# Patient Record
Sex: Male | Born: 1960 | Race: White | Hispanic: No | Marital: Single | Smoking: Former smoker
Health system: Southern US, Community
[De-identification: ages and names within clinical notes are randomized; demographics above are authoritative.]

## PROBLEM LIST (undated history)

## (undated) DIAGNOSIS — I219 Acute myocardial infarction, unspecified: Secondary | ICD-10-CM

## (undated) DIAGNOSIS — M109 Gout, unspecified: Secondary | ICD-10-CM

## (undated) DIAGNOSIS — F32A Depression, unspecified: Secondary | ICD-10-CM

## (undated) DIAGNOSIS — I10 Essential (primary) hypertension: Secondary | ICD-10-CM

## (undated) DIAGNOSIS — F419 Anxiety disorder, unspecified: Secondary | ICD-10-CM

## (undated) DIAGNOSIS — F329 Major depressive disorder, single episode, unspecified: Secondary | ICD-10-CM

## (undated) HISTORY — PX: ANKLE SURGERY: SHX546

## (undated) HISTORY — DX: Acute myocardial infarction, unspecified: I21.9

## (undated) HISTORY — PX: OTHER SURGICAL HISTORY: SHX169

## (undated) HISTORY — DX: Depression, unspecified: F32.A

## (undated) HISTORY — DX: Essential (primary) hypertension: I10

## (undated) HISTORY — DX: Anxiety disorder, unspecified: F41.9

---
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
## 2001-03-31 ENCOUNTER — Ambulatory Visit (HOSPITAL_COMMUNITY): Admission: RE | Admit: 2001-03-31 | Discharge: 2001-03-31 | Payer: Self-pay | Admitting: Internal Medicine

## 2001-03-31 ENCOUNTER — Encounter: Payer: Self-pay | Admitting: Internal Medicine

## 2004-02-18 ENCOUNTER — Ambulatory Visit (HOSPITAL_COMMUNITY): Admission: RE | Admit: 2004-02-18 | Discharge: 2004-02-18 | Payer: Self-pay | Admitting: Internal Medicine

## 2005-02-02 ENCOUNTER — Ambulatory Visit (HOSPITAL_COMMUNITY): Admission: RE | Admit: 2005-02-02 | Discharge: 2005-02-02 | Payer: Self-pay | Admitting: Internal Medicine

## 2005-02-19 ENCOUNTER — Ambulatory Visit: Payer: Self-pay | Admitting: Psychiatry

## 2005-02-19 ENCOUNTER — Inpatient Hospital Stay (HOSPITAL_COMMUNITY): Admission: AD | Admit: 2005-02-19 | Discharge: 2005-02-20 | Payer: Self-pay | Admitting: Psychiatry

## 2017-04-23 HISTORY — PX: ROTATOR CUFF REPAIR: SHX139

## 2017-09-19 ENCOUNTER — Ambulatory Visit (HOSPITAL_COMMUNITY)
Admission: RE | Admit: 2017-09-19 | Discharge: 2017-09-19 | Disposition: A | Discharge status: Home / Self Care | Payer: BLUE CROSS/BLUE SHIELD | Source: Ambulatory Visit | Attending: Internal Medicine | Admitting: Internal Medicine

## 2017-09-19 ENCOUNTER — Other Ambulatory Visit (HOSPITAL_COMMUNITY): Payer: Self-pay | Admitting: Internal Medicine

## 2017-09-19 DIAGNOSIS — M79605 Pain in left leg: Principal | ICD-10-CM

## 2017-09-19 DIAGNOSIS — M79671 Pain in right foot: Secondary | ICD-10-CM

## 2017-09-19 DIAGNOSIS — M79604 Pain in right leg: Secondary | ICD-10-CM

## 2017-09-19 DIAGNOSIS — M79672 Pain in left foot: Secondary | ICD-10-CM

## 2017-12-10 ENCOUNTER — Encounter: Payer: Self-pay | Admitting: Gastroenterology

## 2018-03-25 ENCOUNTER — Ambulatory Visit: Payer: BLUE CROSS/BLUE SHIELD | Admitting: Gastroenterology

## 2018-03-25 DIAGNOSIS — M75121 Complete rotator cuff tear or rupture of right shoulder, not specified as traumatic: Secondary | ICD-10-CM | POA: Diagnosis not present

## 2018-03-27 DIAGNOSIS — M75121 Complete rotator cuff tear or rupture of right shoulder, not specified as traumatic: Secondary | ICD-10-CM | POA: Diagnosis not present

## 2018-04-01 DIAGNOSIS — M75121 Complete rotator cuff tear or rupture of right shoulder, not specified as traumatic: Secondary | ICD-10-CM | POA: Diagnosis not present

## 2018-04-08 DIAGNOSIS — M75121 Complete rotator cuff tear or rupture of right shoulder, not specified as traumatic: Secondary | ICD-10-CM | POA: Diagnosis not present

## 2018-04-10 DIAGNOSIS — M75121 Complete rotator cuff tear or rupture of right shoulder, not specified as traumatic: Secondary | ICD-10-CM | POA: Diagnosis not present

## 2018-04-11 DIAGNOSIS — M25511 Pain in right shoulder: Secondary | ICD-10-CM | POA: Diagnosis not present

## 2018-05-09 DIAGNOSIS — M25511 Pain in right shoulder: Secondary | ICD-10-CM | POA: Diagnosis not present

## 2018-05-15 DIAGNOSIS — Z6838 Body mass index (BMI) 38.0-38.9, adult: Secondary | ICD-10-CM | POA: Diagnosis not present

## 2018-05-15 DIAGNOSIS — F329 Major depressive disorder, single episode, unspecified: Secondary | ICD-10-CM | POA: Diagnosis not present

## 2018-05-15 DIAGNOSIS — Z1389 Encounter for screening for other disorder: Secondary | ICD-10-CM | POA: Diagnosis not present

## 2018-05-15 DIAGNOSIS — I10 Essential (primary) hypertension: Secondary | ICD-10-CM | POA: Diagnosis not present

## 2018-06-12 ENCOUNTER — Encounter: Payer: Self-pay | Admitting: Gastroenterology

## 2018-06-12 ENCOUNTER — Ambulatory Visit: Payer: BLUE CROSS/BLUE SHIELD | Admitting: Gastroenterology

## 2018-06-12 ENCOUNTER — Telehealth: Payer: Self-pay | Admitting: Gastroenterology

## 2018-06-12 NOTE — Telephone Encounter (Signed)
PATIENT WAS A NO SHOW AND LETTER SENT  °

## 2018-10-23 ENCOUNTER — Other Ambulatory Visit (HOSPITAL_COMMUNITY): Payer: Self-pay | Admitting: Physician Assistant

## 2018-10-23 ENCOUNTER — Other Ambulatory Visit: Payer: Self-pay

## 2018-10-23 ENCOUNTER — Ambulatory Visit (HOSPITAL_COMMUNITY)
Admission: RE | Admit: 2018-10-23 | Discharge: 2018-10-23 | Disposition: A | Discharge status: Home / Self Care | Payer: BC Managed Care – PPO | Source: Ambulatory Visit | Attending: Physician Assistant | Admitting: Physician Assistant

## 2018-10-23 DIAGNOSIS — M25562 Pain in left knee: Secondary | ICD-10-CM | POA: Diagnosis not present

## 2018-10-23 DIAGNOSIS — I10 Essential (primary) hypertension: Secondary | ICD-10-CM | POA: Diagnosis not present

## 2018-10-23 DIAGNOSIS — M2392 Unspecified internal derangement of left knee: Secondary | ICD-10-CM | POA: Diagnosis not present

## 2018-10-23 DIAGNOSIS — M1712 Unilateral primary osteoarthritis, left knee: Secondary | ICD-10-CM | POA: Diagnosis not present

## 2018-10-23 DIAGNOSIS — Z1389 Encounter for screening for other disorder: Secondary | ICD-10-CM | POA: Diagnosis not present

## 2018-10-23 DIAGNOSIS — Z6838 Body mass index (BMI) 38.0-38.9, adult: Secondary | ICD-10-CM | POA: Diagnosis not present

## 2018-10-28 DIAGNOSIS — Z6839 Body mass index (BMI) 39.0-39.9, adult: Secondary | ICD-10-CM | POA: Diagnosis not present

## 2018-10-28 DIAGNOSIS — M1712 Unilateral primary osteoarthritis, left knee: Secondary | ICD-10-CM | POA: Diagnosis not present

## 2018-10-28 DIAGNOSIS — I10 Essential (primary) hypertension: Secondary | ICD-10-CM | POA: Diagnosis not present

## 2018-11-06 ENCOUNTER — Encounter: Payer: Self-pay | Admitting: *Deleted

## 2018-11-19 ENCOUNTER — Ambulatory Visit (INDEPENDENT_AMBULATORY_CARE_PROVIDER_SITE_OTHER): Payer: Self-pay | Admitting: *Deleted

## 2018-11-19 ENCOUNTER — Telehealth: Payer: Self-pay | Admitting: *Deleted

## 2018-11-19 ENCOUNTER — Other Ambulatory Visit: Payer: Self-pay

## 2018-11-19 ENCOUNTER — Encounter: Payer: Self-pay | Admitting: *Deleted

## 2018-11-19 DIAGNOSIS — Z1211 Encounter for screening for malignant neoplasm of colon: Secondary | ICD-10-CM

## 2018-11-19 MED ORDER — NA SULFATE-K SULFATE-MG SULF 17.5-3.13-1.6 GM/177ML PO SOLN: 1.0000 | Freq: Once | ORAL | 0 refills | Status: AC

## 2018-11-19 NOTE — Progress Notes (Signed)
Gastroenterology Pre-Procedure Review  Request Date: 11/19/2018 Requesting Physician: Collene Mares, PA-C @ [facility], No previous TCS  PATIENT REVIEW QUESTIONS: The patient responded to the following health history questions as indicated:    1. Diabetes Melitis: No 2. Joint replacements in the past 12 months: No 3. Major health problems in the past 3 months: Yes, bp elevated, seen PCP 3 wks ago, knee injection also 4. Has an artificial valve or MVP: No 5. Has a defibrillator: No 6. Has been advised in past to take antibiotics in advance of a procedure like teeth cleaning: No 7. Family history of colon cancer: No 8. Alcohol Use: Yes, 2 beers a week 9. History of sleep apnea: No 10. History of coronary artery or other vascular stents placed within the last 12 months: No 11. History of any prior anesthesia complications: No    MEDICATIONS & ALLERGIES:    Patient reports the following regarding taking any blood thinners:   Plavix? No Aspirin? Yes Coumadin? No Brilinta? No Xarelto? No Eliquis? No Pradaxa? No Savaysa? No Effient? No  Patient confirms/reports the following medications:  Current Outpatient Medications  Medication Sig Dispense Refill  . aspirin EC 81 MG tablet Take 81 mg by mouth daily.    Marland Kitchen escitalopram (LEXAPRO) 10 MG tablet Take 10 mg by mouth daily.    . Multiple Vitamins-Minerals (MENS 50+ MULTI VITAMIN/MIN) TABS Take by mouth daily.    Marland Kitchen olmesartan-hydrochlorothiazide (BENICAR HCT) 40-12.5 MG tablet Take 1 tablet by mouth daily.     No current facility-administered medications for this visit.     Patient confirms/reports the following allergies:  Allergies  Allergen Reactions  . Codeine Nausea And Vomiting  . Demerol  [Meperidine Hcl] Nausea Only    No orders of the defined types were placed in this encounter.   AUTHORIZATION INFORMATION Primary Insurance: [place],  [place] #: [id number],  Group #: [id number] Pre-Cert / Josem Kaufmann required: Pre-Cert / Auth #:    SCHEDULE INFORMATION: Procedure has been scheduled as follows:  Date: 01/16/2019, Time: 12:00 Location: [facility] with Dr. Gala Romney  This Gastroenterology Pre-Precedure Review Form is being routed to the following provider(s): Roseanne Kaufman, NP

## 2018-11-19 NOTE — Telephone Encounter (Signed)
Pt requested a work note to be mailed out stating that he must remain in quarantine after he has his COVID screening.  Mailed along with prep instructions.

## 2018-11-19 NOTE — Patient Instructions (Signed)
Dean Wang  Mar 30, 1961 MRN: [id number]     Procedure Date: 01/16/2019 Time to register: 11:00 am Place to register: [facility] Procedure Time: 12:00 pm Scheduled provider: Dr. Gala Romney    PREPARATION FOR COLONOSCOPY WITH SUPREP BOWEL PREP KIT  Note: Suprep Bowel Prep Kit is a split-dose (2day) regimen. Consumption of BOTH 6-ounce bottles is required for a complete prep.  Please notify us immediately if you are diabetic, take iron supplements, or if you are on Coumadin or any other blood thinners.                                                                                                                                                   2 DAYS BEFORE PROCEDURE:  DATE: 01/14/2019 DAY: Wednesday Begin clear liquid diet AFTER your lunch meal. NO SOLID FOODS after this point.  1 DAY BEFORE PROCEDURE:  DATE: 01/15/2019   DAY: Thursday Continue clear liquids the entire day - NO SOLID FOOD.    At 6:00pm: Complete steps 1 through 4 below, using ONE (1) 6-ounce bottle, before going to bed. Step 1:  Pour ONE (1) 6-ounce bottle of SUPREP liquid into the mixing container.  Step 2:  Add cool drinking water to the 16 ounce line on the container and mix.  Note: Dilute the solution concentrate as directed prior to use. Step 3:  DRINK ALL the liquid in the container. Step 4:  You MUST drink an additional two (2) or more 16 ounce containers of water over the next one (1) hour.   Continue clear liquids.  DAY OF PROCEDURE:   DATE: 01/16/2019  DAY: Friday If you take medications for your heart, blood pressure, or breathing, you may take these medications.    5 hours before your procedure at : 7:00 am Step 1:  Pour ONE (1) 6-ounce bottle of SUPREP liquid into the mixing container.  Step 2:  Add cool drinking water to the 16 ounce line on the container and mix.  Note: Dilute the solution concentrate as directed prior to use. Step 3:  DRINK ALL the liquid in the container. Step 4:   You MUST drink an additional two (2) or more 16 ounce containers of water over the next one (1) hour. You MUST complete the final glass of water at least 3 hours before your colonoscopy. Nothing by mouth past 9:00 am  You may take your morning medications with sip of water unless we have instructed otherwise.    Please see below for Dietary Information.  CLEAR LIQUIDS INCLUDE:  Water Jello (NOT red in color)   Ice Popsicles (NOT red in color)   Tea (sugar ok, no milk/cream) Powdered fruit flavored drinks  Coffee (sugar ok, no milk/cream) Gatorade/ Lemonade/ Kool-Aid  (NOT red in color)   Juice: apple, white grape, white cranberry Soft drinks  Clear bullion, consomme, broth (fat free beef/chicken/vegetable)  Carbonated beverages (any kind)  Strained chicken noodle soup Hard Candy   Remember: Clear liquids are liquids that will allow you to see your fingers on the other side of a clear glass. Be sure liquids are NOT red in color, and not cloudy, but CLEAR.  DO NOT EAT OR DRINK ANY OF THE FOLLOWING:  Dairy products of any kind   Cranberry juice Tomato juice / V8 juice   Grapefruit juice Orange juice     Red grape juice  Do not eat any solid foods, including such foods as: cereal, oatmeal, yogurt, fruits, vegetables, creamed soups, eggs, bread, crackers, pureed foods in a blender, etc.   HELPFUL HINTS FOR DRINKING PREP SOLUTION:   Make sure prep is extremely cold. Mix and refrigerate the the morning of the prep. You may also put in the freezer.   You may try mixing some Crystal Light or Country Time Lemonade if you prefer. Mix in small amounts; add more if necessary.  Try drinking through a straw  Rinse mouth with water or a mouthwash between glasses, to remove after-taste.  Try sipping on a cold beverage /ice/ popsicles between glasses of prep.  Place a piece of sugar-free hard candy in mouth between glasses.  If you become nauseated, try consuming smaller amounts, or stretch  out the time between glasses. Stop for 30-60 minutes, then slowly start back drinking.     OTHER INSTRUCTIONS  You will need a responsible adult at least 60 years of age to accompany you and drive you home. This person must remain in the waiting room during your procedure. The hospital will cancel your procedure if you do not have a responsible adult with you.   1. Wear loose fitting clothing that is easily removed. 2. Leave jewelry and other valuables at home.  3. Remove all body piercing jewelry and leave at home. 4. Total time from sign-in until discharge is approximately 2-3 hours. 5. You should go home directly after your procedure and rest. You can resume normal activities the day after your procedure. 6. The day of your procedure you should not:  Drive  Make legal decisions  Operate machinery  Drink alcohol  Return to work   You may call the office (Dept: [phone]) before 5:00pm, or page the doctor on call [phone]) after 5:00pm, for further instructions, if necessary.   Insurance Information YOU WILL NEED TO CHECK WITH YOUR INSURANCE COMPANY FOR THE BENEFITS OF COVERAGE YOU HAVE FOR THIS PROCEDURE.  UNFORTUNATELY, NOT ALL INSURANCE COMPANIES HAVE BENEFITS TO COVER ALL OR PART OF THESE TYPES OF PROCEDURES.  IT IS YOUR RESPONSIBILITY TO CHECK YOUR BENEFITS, HOWEVER, WE WILL BE GLAD TO ASSIST YOU WITH ANY CODES YOUR INSURANCE COMPANY MAY NEED.    PLEASE NOTE THAT MOST INSURANCE COMPANIES WILL NOT COVER A SCREENING COLONOSCOPY FOR PEOPLE UNDER THE AGE OF 50  IF YOU HAVE BCBS INSURANCE, YOU MAY HAVE BENEFITS FOR A SCREENING COLONOSCOPY BUT IF POLYPS ARE FOUND THE DIAGNOSIS WILL CHANGE AND THEN YOU MAY HAVE A DEDUCTIBLE THAT WILL NEED TO BE MET. SO PLEASE MAKE SURE YOU CHECK YOUR BENEFITS FOR A SCREENING COLONOSCOPY AS WELL AS A DIAGNOSTIC COLONOSCOPY.

## 2018-11-19 NOTE — Progress Notes (Signed)
Recommend Propofol. Please arrange office visit.

## 2018-11-20 ENCOUNTER — Encounter: Payer: Self-pay | Admitting: *Deleted

## 2018-11-20 NOTE — Progress Notes (Signed)
Letter was mailed with office visit appointment information including procedure cancellation.

## 2018-12-09 DIAGNOSIS — Z6838 Body mass index (BMI) 38.0-38.9, adult: Secondary | ICD-10-CM | POA: Diagnosis not present

## 2018-12-09 DIAGNOSIS — I10 Essential (primary) hypertension: Secondary | ICD-10-CM | POA: Diagnosis not present

## 2018-12-09 DIAGNOSIS — M1712 Unilateral primary osteoarthritis, left knee: Secondary | ICD-10-CM | POA: Diagnosis not present

## 2018-12-24 ENCOUNTER — Ambulatory Visit (INDEPENDENT_AMBULATORY_CARE_PROVIDER_SITE_OTHER): Payer: BC Managed Care – PPO | Admitting: Gastroenterology

## 2018-12-24 ENCOUNTER — Other Ambulatory Visit: Payer: Self-pay

## 2018-12-24 ENCOUNTER — Encounter: Payer: Self-pay | Admitting: Gastroenterology

## 2018-12-24 DIAGNOSIS — Z1211 Encounter for screening for malignant neoplasm of colon: Secondary | ICD-10-CM

## 2018-12-24 DIAGNOSIS — Z9189 Other specified personal risk factors, not elsewhere classified: Secondary | ICD-10-CM

## 2018-12-24 NOTE — Assessment & Plan Note (Signed)
Due for first-ever screening colonoscopy.  Denies GI symptoms.  Patient is concerned about receiving any type of pain medications during procedure which may induce significant nausea and vomiting.  Previously did poorly with Demerol.  Plan for deep sedation with assistance from anesthesiology due to antidepressant use, mild alcohol use and history of intolerance to medications.  I have discussed the risks, alternatives, benefits with regards to but not limited to the risk of reaction to medication, bleeding, infection, perforation and the patient is agreeable to proceed. Written consent to be obtained.

## 2018-12-24 NOTE — Patient Instructions (Signed)
1. Colonoscopy as scheduled. See separate instructions.  
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
## 2018-12-27 DIAGNOSIS — Z23 Encounter for immunization: Secondary | ICD-10-CM | POA: Diagnosis not present

## 2019-01-14 ENCOUNTER — Other Ambulatory Visit (HOSPITAL_COMMUNITY): Payer: BC Managed Care – PPO

## 2019-03-02 ENCOUNTER — Telehealth: Payer: Self-pay | Admitting: Internal Medicine

## 2019-03-02 NOTE — Telephone Encounter (Signed)
Tried to call mobile number, no answer, LMOVM for return call. Tried to call home number, no answer.

## 2019-03-02 NOTE — Telephone Encounter (Signed)
Patient needs to reschedule his procedure  

## 2019-03-03 ENCOUNTER — Encounter (HOSPITAL_COMMUNITY): Admission: RE | Admit: 2019-03-03 | Payer: BC Managed Care – PPO | Source: Ambulatory Visit

## 2019-03-03 ENCOUNTER — Other Ambulatory Visit (HOSPITAL_COMMUNITY): Payer: BC Managed Care – PPO

## 2019-03-05 ENCOUNTER — Encounter (HOSPITAL_COMMUNITY): Admission: RE | Payer: Self-pay | Source: Home / Self Care

## 2019-03-05 ENCOUNTER — Ambulatory Visit (HOSPITAL_COMMUNITY)
Admission: RE | Admit: 2019-03-05 | Payer: BC Managed Care – PPO | Source: Home / Self Care | Admitting: Internal Medicine

## 2019-03-05 SURGERY — COLONOSCOPY WITH PROPOFOL
Anesthesia: Monitor Anesthesia Care

## 2019-03-05 NOTE — Telephone Encounter (Signed)
Tried to call pt, no answer, LMOVM for return call.  

## 2019-03-05 NOTE — Telephone Encounter (Signed)
Letter mailed

## 2019-06-24 DIAGNOSIS — F329 Major depressive disorder, single episode, unspecified: Secondary | ICD-10-CM | POA: Diagnosis not present

## 2019-06-24 DIAGNOSIS — L03115 Cellulitis of right lower limb: Secondary | ICD-10-CM | POA: Diagnosis not present

## 2019-06-24 DIAGNOSIS — Z6841 Body Mass Index (BMI) 40.0 and over, adult: Secondary | ICD-10-CM | POA: Diagnosis not present

## 2019-06-24 DIAGNOSIS — I10 Essential (primary) hypertension: Secondary | ICD-10-CM | POA: Diagnosis not present

## 2019-07-03 DIAGNOSIS — M79671 Pain in right foot: Secondary | ICD-10-CM | POA: Diagnosis not present

## 2019-08-06 DIAGNOSIS — M1990 Unspecified osteoarthritis, unspecified site: Secondary | ICD-10-CM | POA: Diagnosis not present

## 2019-08-06 DIAGNOSIS — M79671 Pain in right foot: Secondary | ICD-10-CM | POA: Diagnosis not present

## 2019-08-06 DIAGNOSIS — G5762 Lesion of plantar nerve, left lower limb: Secondary | ICD-10-CM | POA: Diagnosis not present

## 2019-08-06 DIAGNOSIS — Z79899 Other long term (current) drug therapy: Secondary | ICD-10-CM | POA: Diagnosis not present

## 2019-08-06 DIAGNOSIS — I10 Essential (primary) hypertension: Secondary | ICD-10-CM | POA: Diagnosis not present

## 2019-08-06 DIAGNOSIS — Z6841 Body Mass Index (BMI) 40.0 and over, adult: Secondary | ICD-10-CM | POA: Diagnosis not present

## 2019-08-06 DIAGNOSIS — Z1389 Encounter for screening for other disorder: Secondary | ICD-10-CM | POA: Diagnosis not present

## 2019-08-12 ENCOUNTER — Encounter: Payer: Self-pay | Admitting: Internal Medicine

## 2019-08-27 DIAGNOSIS — Z23 Encounter for immunization: Secondary | ICD-10-CM | POA: Diagnosis not present

## 2019-08-28 DIAGNOSIS — I77811 Abdominal aortic ectasia: Secondary | ICD-10-CM | POA: Diagnosis not present

## 2019-08-28 DIAGNOSIS — R7989 Other specified abnormal findings of blood chemistry: Secondary | ICD-10-CM | POA: Diagnosis not present

## 2019-08-28 DIAGNOSIS — R945 Abnormal results of liver function studies: Secondary | ICD-10-CM | POA: Diagnosis not present

## 2019-09-11 ENCOUNTER — Encounter: Payer: Self-pay | Admitting: Gastroenterology

## 2019-09-11 ENCOUNTER — Ambulatory Visit: Payer: BC Managed Care – PPO | Admitting: Gastroenterology

## 2019-09-11 DIAGNOSIS — M19072 Primary osteoarthritis, left ankle and foot: Secondary | ICD-10-CM | POA: Diagnosis not present

## 2019-09-11 DIAGNOSIS — M79671 Pain in right foot: Secondary | ICD-10-CM | POA: Diagnosis not present

## 2019-09-11 DIAGNOSIS — M79672 Pain in left foot: Secondary | ICD-10-CM | POA: Diagnosis not present

## 2019-09-11 DIAGNOSIS — M7741 Metatarsalgia, right foot: Secondary | ICD-10-CM | POA: Diagnosis not present

## 2019-09-11 DIAGNOSIS — Z9889 Other specified postprocedural states: Secondary | ICD-10-CM | POA: Diagnosis not present

## 2019-09-11 DIAGNOSIS — M25572 Pain in left ankle and joints of left foot: Secondary | ICD-10-CM | POA: Diagnosis not present

## 2019-09-29 DIAGNOSIS — Z23 Encounter for immunization: Secondary | ICD-10-CM | POA: Diagnosis not present

## 2019-11-03 DIAGNOSIS — M79672 Pain in left foot: Secondary | ICD-10-CM | POA: Diagnosis not present

## 2019-11-03 DIAGNOSIS — M25572 Pain in left ankle and joints of left foot: Secondary | ICD-10-CM | POA: Diagnosis not present

## 2020-01-26 DIAGNOSIS — M25572 Pain in left ankle and joints of left foot: Secondary | ICD-10-CM | POA: Diagnosis not present

## 2020-01-26 DIAGNOSIS — M79672 Pain in left foot: Secondary | ICD-10-CM | POA: Diagnosis not present

## 2020-01-26 DIAGNOSIS — G5792 Unspecified mononeuropathy of left lower limb: Secondary | ICD-10-CM | POA: Diagnosis not present

## 2020-03-06 DIAGNOSIS — M7731 Calcaneal spur, right foot: Secondary | ICD-10-CM | POA: Diagnosis not present

## 2020-03-06 DIAGNOSIS — M79671 Pain in right foot: Secondary | ICD-10-CM | POA: Diagnosis not present

## 2020-03-06 DIAGNOSIS — M10071 Idiopathic gout, right ankle and foot: Secondary | ICD-10-CM | POA: Diagnosis not present

## 2020-03-06 DIAGNOSIS — M7989 Other specified soft tissue disorders: Secondary | ICD-10-CM | POA: Diagnosis not present

## 2020-03-06 DIAGNOSIS — M109 Gout, unspecified: Secondary | ICD-10-CM | POA: Diagnosis not present

## 2020-04-05 DIAGNOSIS — Z23 Encounter for immunization: Secondary | ICD-10-CM | POA: Diagnosis not present

## 2020-04-19 DIAGNOSIS — M79672 Pain in left foot: Secondary | ICD-10-CM | POA: Diagnosis not present

## 2020-04-19 DIAGNOSIS — M109 Gout, unspecified: Secondary | ICD-10-CM | POA: Diagnosis not present

## 2020-04-20 DIAGNOSIS — M109 Gout, unspecified: Secondary | ICD-10-CM | POA: Diagnosis not present

## 2020-04-20 DIAGNOSIS — M79672 Pain in left foot: Secondary | ICD-10-CM | POA: Diagnosis not present

## 2020-05-27 DIAGNOSIS — M109 Gout, unspecified: Secondary | ICD-10-CM | POA: Diagnosis not present

## 2020-06-05 DIAGNOSIS — R0602 Shortness of breath: Secondary | ICD-10-CM | POA: Diagnosis not present

## 2020-06-05 DIAGNOSIS — J45909 Unspecified asthma, uncomplicated: Secondary | ICD-10-CM | POA: Diagnosis not present

## 2020-06-05 DIAGNOSIS — I447 Left bundle-branch block, unspecified: Secondary | ICD-10-CM | POA: Diagnosis not present

## 2020-06-05 DIAGNOSIS — J811 Chronic pulmonary edema: Secondary | ICD-10-CM | POA: Diagnosis not present

## 2020-06-05 DIAGNOSIS — R778 Other specified abnormalities of plasma proteins: Secondary | ICD-10-CM | POA: Diagnosis not present

## 2020-06-05 DIAGNOSIS — Z87891 Personal history of nicotine dependence: Secondary | ICD-10-CM | POA: Diagnosis not present

## 2020-06-05 DIAGNOSIS — J80 Acute respiratory distress syndrome: Secondary | ICD-10-CM | POA: Diagnosis not present

## 2020-06-05 DIAGNOSIS — I2510 Atherosclerotic heart disease of native coronary artery without angina pectoris: Secondary | ICD-10-CM | POA: Diagnosis not present

## 2020-06-05 DIAGNOSIS — I472 Ventricular tachycardia: Secondary | ICD-10-CM | POA: Diagnosis not present

## 2020-06-05 DIAGNOSIS — R0609 Other forms of dyspnea: Secondary | ICD-10-CM | POA: Diagnosis not present

## 2020-06-05 DIAGNOSIS — I110 Hypertensive heart disease with heart failure: Secondary | ICD-10-CM | POA: Diagnosis not present

## 2020-06-05 DIAGNOSIS — R0902 Hypoxemia: Secondary | ICD-10-CM | POA: Diagnosis not present

## 2020-06-05 DIAGNOSIS — Z79899 Other long term (current) drug therapy: Secondary | ICD-10-CM | POA: Diagnosis not present

## 2020-06-05 DIAGNOSIS — Z885 Allergy status to narcotic agent status: Secondary | ICD-10-CM | POA: Diagnosis not present

## 2020-06-05 DIAGNOSIS — M109 Gout, unspecified: Secondary | ICD-10-CM | POA: Diagnosis not present

## 2020-06-05 DIAGNOSIS — I5031 Acute diastolic (congestive) heart failure: Secondary | ICD-10-CM | POA: Diagnosis not present

## 2020-06-05 DIAGNOSIS — I5030 Unspecified diastolic (congestive) heart failure: Secondary | ICD-10-CM | POA: Diagnosis not present

## 2020-06-05 DIAGNOSIS — R918 Other nonspecific abnormal finding of lung field: Secondary | ICD-10-CM | POA: Diagnosis not present

## 2020-06-05 DIAGNOSIS — I161 Hypertensive emergency: Secondary | ICD-10-CM | POA: Diagnosis not present

## 2020-06-05 DIAGNOSIS — R069 Unspecified abnormalities of breathing: Secondary | ICD-10-CM | POA: Diagnosis not present

## 2020-06-05 DIAGNOSIS — Z6834 Body mass index (BMI) 34.0-34.9, adult: Secondary | ICD-10-CM | POA: Diagnosis not present

## 2020-06-05 DIAGNOSIS — Z791 Long term (current) use of non-steroidal anti-inflammatories (NSAID): Secondary | ICD-10-CM | POA: Diagnosis not present

## 2020-06-05 DIAGNOSIS — R9431 Abnormal electrocardiogram [ECG] [EKG]: Secondary | ICD-10-CM | POA: Diagnosis not present

## 2020-06-05 DIAGNOSIS — I499 Cardiac arrhythmia, unspecified: Secondary | ICD-10-CM | POA: Diagnosis not present

## 2020-06-05 DIAGNOSIS — R7989 Other specified abnormal findings of blood chemistry: Secondary | ICD-10-CM | POA: Diagnosis not present

## 2020-06-05 DIAGNOSIS — E669 Obesity, unspecified: Secondary | ICD-10-CM | POA: Diagnosis not present

## 2020-06-05 DIAGNOSIS — R0989 Other specified symptoms and signs involving the circulatory and respiratory systems: Secondary | ICD-10-CM | POA: Diagnosis not present

## 2020-06-05 DIAGNOSIS — Z20822 Contact with and (suspected) exposure to covid-19: Secondary | ICD-10-CM | POA: Diagnosis not present

## 2020-06-05 DIAGNOSIS — R0600 Dyspnea, unspecified: Secondary | ICD-10-CM | POA: Diagnosis not present

## 2020-06-05 DIAGNOSIS — I444 Left anterior fascicular block: Secondary | ICD-10-CM | POA: Diagnosis not present

## 2020-06-05 DIAGNOSIS — I517 Cardiomegaly: Secondary | ICD-10-CM | POA: Diagnosis not present

## 2020-06-05 DIAGNOSIS — I509 Heart failure, unspecified: Secondary | ICD-10-CM | POA: Diagnosis not present

## 2020-06-05 DIAGNOSIS — I422 Other hypertrophic cardiomyopathy: Secondary | ICD-10-CM | POA: Diagnosis not present

## 2020-06-05 DIAGNOSIS — E876 Hypokalemia: Secondary | ICD-10-CM | POA: Diagnosis not present

## 2020-06-05 DIAGNOSIS — I5020 Unspecified systolic (congestive) heart failure: Secondary | ICD-10-CM | POA: Diagnosis not present

## 2020-06-05 DIAGNOSIS — I214 Non-ST elevation (NSTEMI) myocardial infarction: Secondary | ICD-10-CM | POA: Diagnosis not present

## 2020-06-05 DIAGNOSIS — R748 Abnormal levels of other serum enzymes: Secondary | ICD-10-CM | POA: Diagnosis not present

## 2020-06-05 DIAGNOSIS — Z7901 Long term (current) use of anticoagulants: Secondary | ICD-10-CM | POA: Diagnosis not present

## 2020-06-13 DIAGNOSIS — R6889 Other general symptoms and signs: Secondary | ICD-10-CM | POA: Diagnosis not present

## 2020-06-13 DIAGNOSIS — Z6838 Body mass index (BMI) 38.0-38.9, adult: Secondary | ICD-10-CM | POA: Diagnosis not present

## 2020-06-13 DIAGNOSIS — Z1331 Encounter for screening for depression: Secondary | ICD-10-CM | POA: Diagnosis not present

## 2020-06-13 DIAGNOSIS — I10 Essential (primary) hypertension: Secondary | ICD-10-CM | POA: Diagnosis not present

## 2020-06-13 DIAGNOSIS — I219 Acute myocardial infarction, unspecified: Secondary | ICD-10-CM | POA: Diagnosis not present

## 2020-06-13 DIAGNOSIS — R0609 Other forms of dyspnea: Secondary | ICD-10-CM | POA: Diagnosis not present

## 2020-06-28 DIAGNOSIS — E785 Hyperlipidemia, unspecified: Secondary | ICD-10-CM | POA: Diagnosis not present

## 2020-06-28 DIAGNOSIS — I252 Old myocardial infarction: Secondary | ICD-10-CM | POA: Diagnosis not present

## 2020-06-28 DIAGNOSIS — I10 Essential (primary) hypertension: Secondary | ICD-10-CM | POA: Diagnosis not present

## 2020-06-28 DIAGNOSIS — I25118 Atherosclerotic heart disease of native coronary artery with other forms of angina pectoris: Secondary | ICD-10-CM | POA: Diagnosis not present

## 2020-07-01 DIAGNOSIS — I10 Essential (primary) hypertension: Secondary | ICD-10-CM | POA: Diagnosis not present

## 2020-07-22 DIAGNOSIS — M109 Gout, unspecified: Secondary | ICD-10-CM | POA: Diagnosis not present

## 2020-09-27 DIAGNOSIS — I5020 Unspecified systolic (congestive) heart failure: Secondary | ICD-10-CM | POA: Diagnosis not present

## 2020-09-27 DIAGNOSIS — E785 Hyperlipidemia, unspecified: Secondary | ICD-10-CM | POA: Diagnosis not present

## 2020-09-27 DIAGNOSIS — I10 Essential (primary) hypertension: Secondary | ICD-10-CM | POA: Diagnosis not present

## 2020-09-27 DIAGNOSIS — I25118 Atherosclerotic heart disease of native coronary artery with other forms of angina pectoris: Secondary | ICD-10-CM | POA: Diagnosis not present

## 2020-10-03 DIAGNOSIS — I5020 Unspecified systolic (congestive) heart failure: Secondary | ICD-10-CM | POA: Diagnosis not present

## 2020-10-18 DIAGNOSIS — I5020 Unspecified systolic (congestive) heart failure: Secondary | ICD-10-CM | POA: Diagnosis not present

## 2020-10-18 DIAGNOSIS — I5022 Chronic systolic (congestive) heart failure: Secondary | ICD-10-CM | POA: Diagnosis not present

## 2020-10-18 DIAGNOSIS — I25118 Atherosclerotic heart disease of native coronary artery with other forms of angina pectoris: Secondary | ICD-10-CM | POA: Diagnosis not present

## 2020-10-20 DIAGNOSIS — R0602 Shortness of breath: Secondary | ICD-10-CM | POA: Diagnosis not present

## 2020-10-20 DIAGNOSIS — I25118 Atherosclerotic heart disease of native coronary artery with other forms of angina pectoris: Secondary | ICD-10-CM | POA: Diagnosis not present

## 2020-10-20 DIAGNOSIS — E785 Hyperlipidemia, unspecified: Secondary | ICD-10-CM | POA: Diagnosis not present

## 2020-10-20 DIAGNOSIS — I10 Essential (primary) hypertension: Secondary | ICD-10-CM | POA: Diagnosis not present

## 2020-12-16 IMAGING — DX LEFT KNEE - COMPLETE 4+ VIEW
4 series · 4 of 4 positions shown · non-contrast
Comparison: 09/09/2017

CLINICAL DATA: Knee pain, initial encounter

EXAM:
LEFT KNEE - COMPLETE 4+ VIEW

[knee ap]
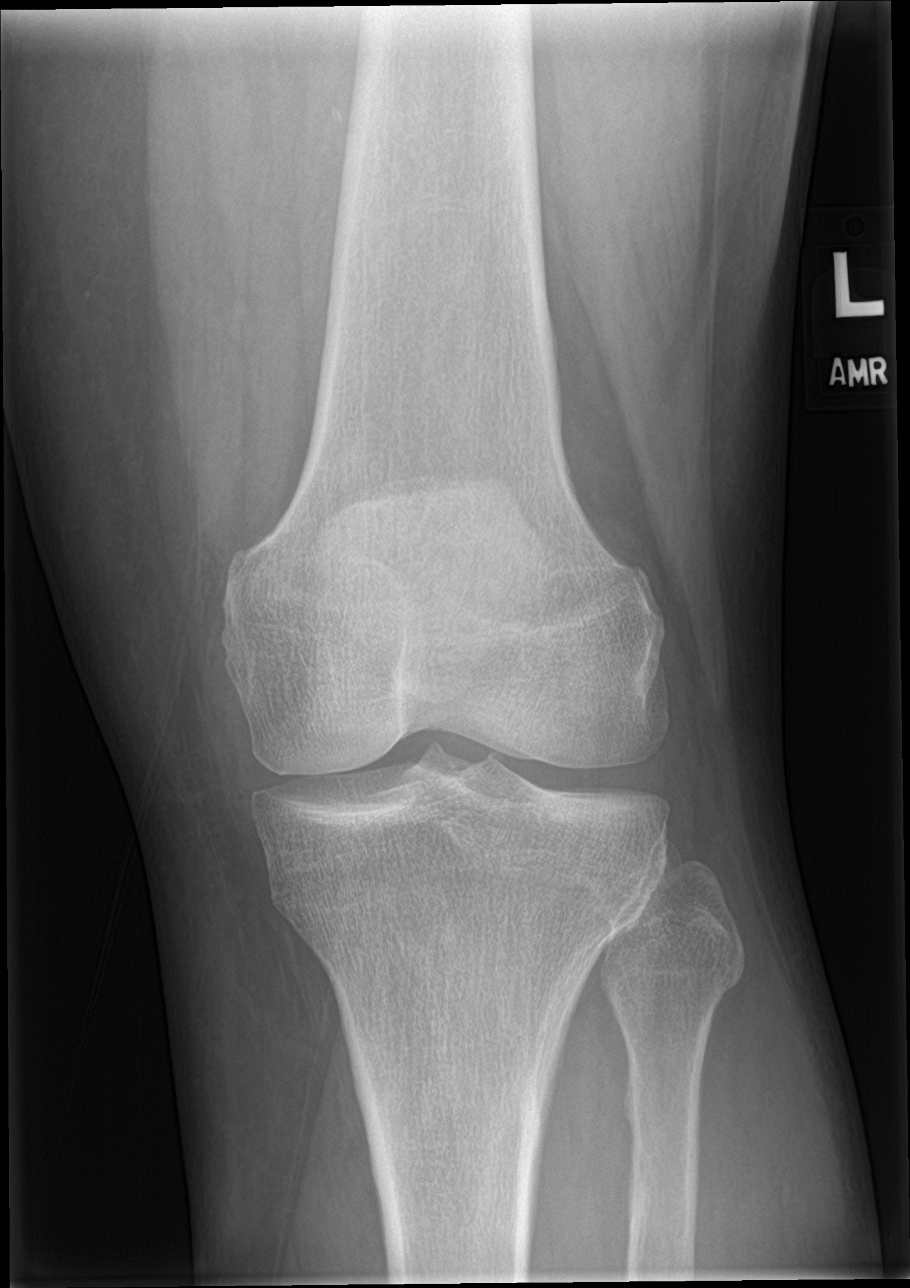

[tunnel]
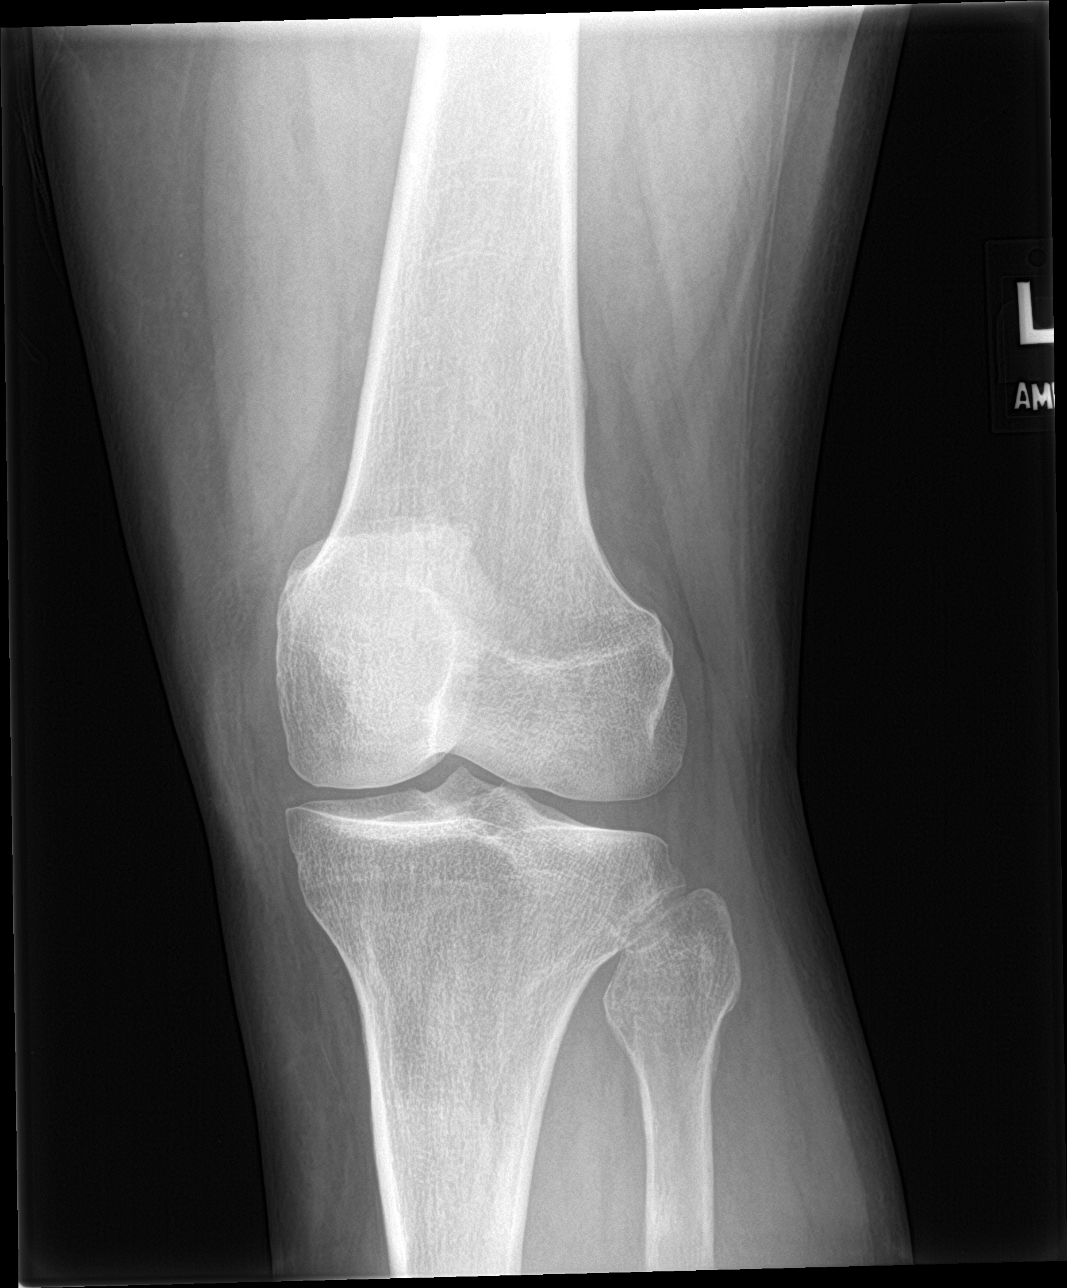

[knee lat]
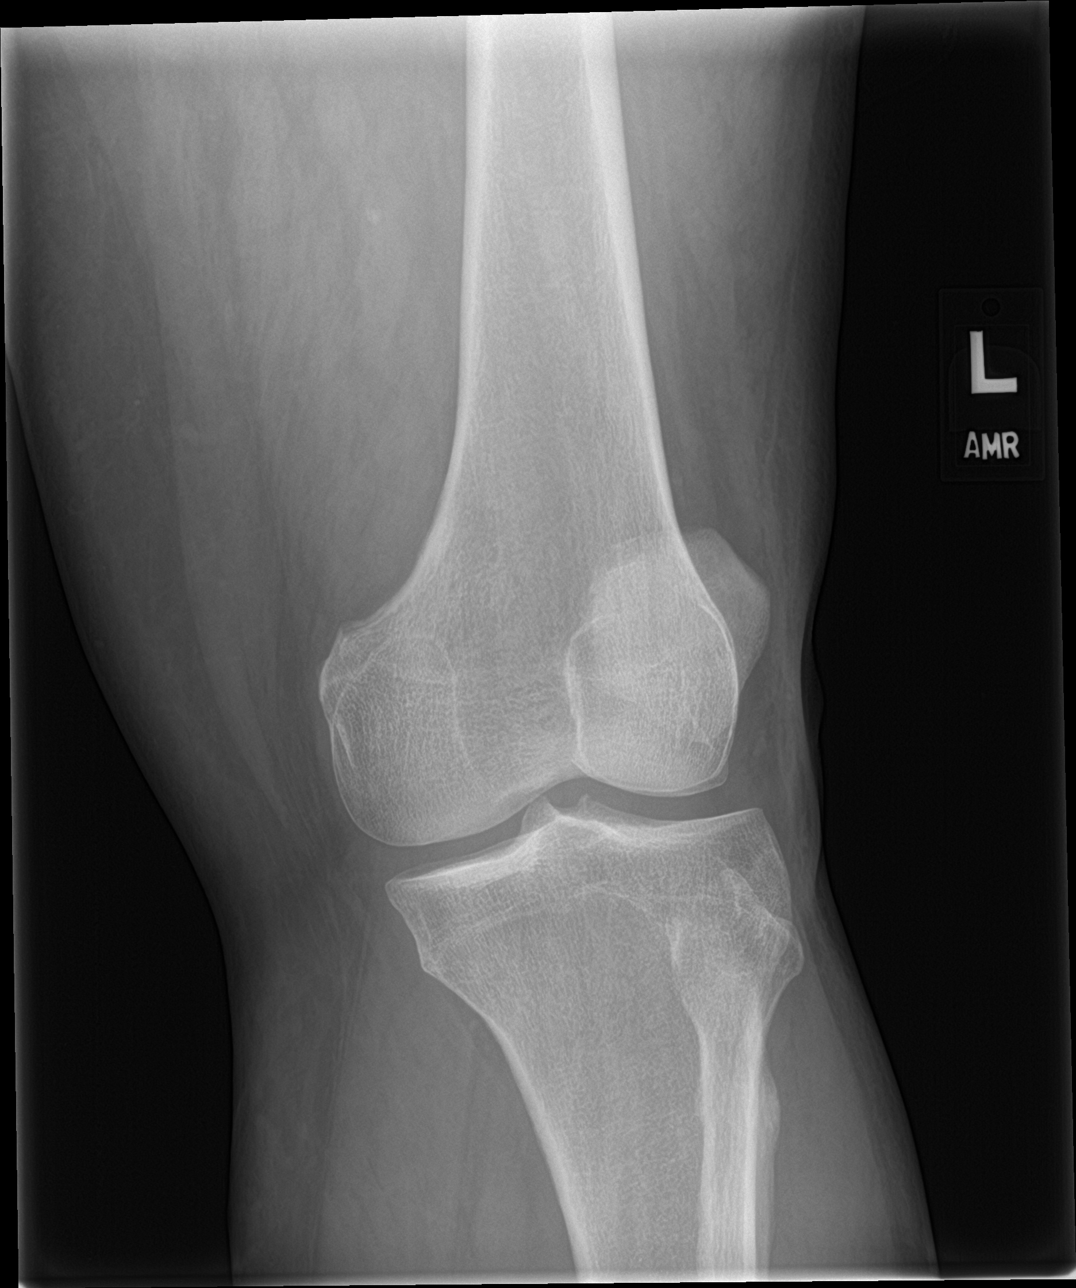

[knee sunrise]
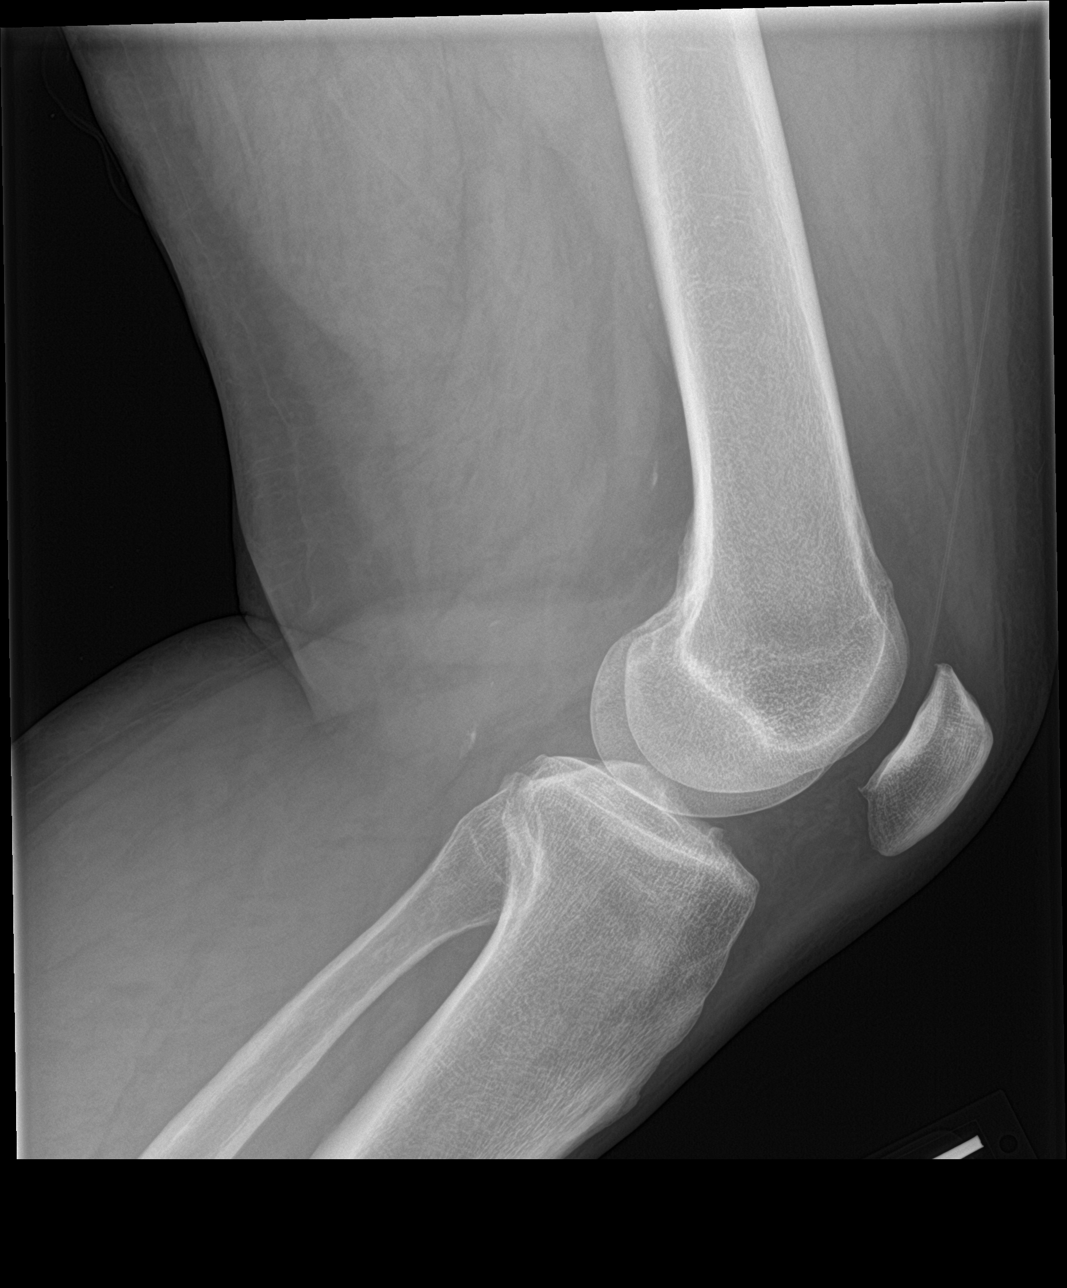

[4 of 4 positions shown; findings below may reference images not displayed]

FINDINGS: Mild degenerative changes of the patellofemoral joint space are
noted. No acute fracture or dislocation is noted.
IMPRESSION: Mild degenerative change without acute abnormality.

## 2021-01-17 DIAGNOSIS — E785 Hyperlipidemia, unspecified: Secondary | ICD-10-CM | POA: Diagnosis not present

## 2021-01-17 DIAGNOSIS — I25118 Atherosclerotic heart disease of native coronary artery with other forms of angina pectoris: Secondary | ICD-10-CM | POA: Diagnosis not present

## 2021-01-17 DIAGNOSIS — Z6839 Body mass index (BMI) 39.0-39.9, adult: Secondary | ICD-10-CM | POA: Diagnosis not present

## 2021-01-17 DIAGNOSIS — I10 Essential (primary) hypertension: Secondary | ICD-10-CM | POA: Diagnosis not present

## 2021-02-07 DIAGNOSIS — M109 Gout, unspecified: Secondary | ICD-10-CM | POA: Diagnosis not present

## 2021-04-12 DIAGNOSIS — M9903 Segmental and somatic dysfunction of lumbar region: Secondary | ICD-10-CM | POA: Diagnosis not present

## 2021-04-12 DIAGNOSIS — M9901 Segmental and somatic dysfunction of cervical region: Secondary | ICD-10-CM | POA: Diagnosis not present

## 2021-04-12 DIAGNOSIS — M5134 Other intervertebral disc degeneration, thoracic region: Secondary | ICD-10-CM | POA: Diagnosis not present

## 2021-04-12 DIAGNOSIS — M5033 Other cervical disc degeneration, cervicothoracic region: Secondary | ICD-10-CM | POA: Diagnosis not present

## 2021-04-19 DIAGNOSIS — M9903 Segmental and somatic dysfunction of lumbar region: Secondary | ICD-10-CM | POA: Diagnosis not present

## 2021-04-19 DIAGNOSIS — M5033 Other cervical disc degeneration, cervicothoracic region: Secondary | ICD-10-CM | POA: Diagnosis not present

## 2021-04-19 DIAGNOSIS — M9901 Segmental and somatic dysfunction of cervical region: Secondary | ICD-10-CM | POA: Diagnosis not present

## 2021-04-19 DIAGNOSIS — M5134 Other intervertebral disc degeneration, thoracic region: Secondary | ICD-10-CM | POA: Diagnosis not present

## 2021-04-20 DIAGNOSIS — M9903 Segmental and somatic dysfunction of lumbar region: Secondary | ICD-10-CM | POA: Diagnosis not present

## 2021-04-20 DIAGNOSIS — M5033 Other cervical disc degeneration, cervicothoracic region: Secondary | ICD-10-CM | POA: Diagnosis not present

## 2021-04-20 DIAGNOSIS — M9901 Segmental and somatic dysfunction of cervical region: Secondary | ICD-10-CM | POA: Diagnosis not present

## 2021-04-20 DIAGNOSIS — M5134 Other intervertebral disc degeneration, thoracic region: Secondary | ICD-10-CM | POA: Diagnosis not present

## 2021-04-24 DIAGNOSIS — M9903 Segmental and somatic dysfunction of lumbar region: Secondary | ICD-10-CM | POA: Diagnosis not present

## 2021-04-24 DIAGNOSIS — M9901 Segmental and somatic dysfunction of cervical region: Secondary | ICD-10-CM | POA: Diagnosis not present

## 2021-04-24 DIAGNOSIS — M5033 Other cervical disc degeneration, cervicothoracic region: Secondary | ICD-10-CM | POA: Diagnosis not present

## 2021-04-24 DIAGNOSIS — M5134 Other intervertebral disc degeneration, thoracic region: Secondary | ICD-10-CM | POA: Diagnosis not present

## 2021-04-25 DIAGNOSIS — M5134 Other intervertebral disc degeneration, thoracic region: Secondary | ICD-10-CM | POA: Diagnosis not present

## 2021-04-25 DIAGNOSIS — M9903 Segmental and somatic dysfunction of lumbar region: Secondary | ICD-10-CM | POA: Diagnosis not present

## 2021-04-25 DIAGNOSIS — M9901 Segmental and somatic dysfunction of cervical region: Secondary | ICD-10-CM | POA: Diagnosis not present

## 2021-04-25 DIAGNOSIS — M5033 Other cervical disc degeneration, cervicothoracic region: Secondary | ICD-10-CM | POA: Diagnosis not present

## 2021-04-27 DIAGNOSIS — M9903 Segmental and somatic dysfunction of lumbar region: Secondary | ICD-10-CM | POA: Diagnosis not present

## 2021-04-27 DIAGNOSIS — M5134 Other intervertebral disc degeneration, thoracic region: Secondary | ICD-10-CM | POA: Diagnosis not present

## 2021-04-27 DIAGNOSIS — M5033 Other cervical disc degeneration, cervicothoracic region: Secondary | ICD-10-CM | POA: Diagnosis not present

## 2021-04-27 DIAGNOSIS — M9901 Segmental and somatic dysfunction of cervical region: Secondary | ICD-10-CM | POA: Diagnosis not present

## 2021-05-01 DIAGNOSIS — M5033 Other cervical disc degeneration, cervicothoracic region: Secondary | ICD-10-CM | POA: Diagnosis not present

## 2021-05-01 DIAGNOSIS — M9903 Segmental and somatic dysfunction of lumbar region: Secondary | ICD-10-CM | POA: Diagnosis not present

## 2021-05-01 DIAGNOSIS — M5134 Other intervertebral disc degeneration, thoracic region: Secondary | ICD-10-CM | POA: Diagnosis not present

## 2021-05-01 DIAGNOSIS — M9901 Segmental and somatic dysfunction of cervical region: Secondary | ICD-10-CM | POA: Diagnosis not present

## 2021-05-03 DIAGNOSIS — M9903 Segmental and somatic dysfunction of lumbar region: Secondary | ICD-10-CM | POA: Diagnosis not present

## 2021-05-03 DIAGNOSIS — M9901 Segmental and somatic dysfunction of cervical region: Secondary | ICD-10-CM | POA: Diagnosis not present

## 2021-05-03 DIAGNOSIS — M5134 Other intervertebral disc degeneration, thoracic region: Secondary | ICD-10-CM | POA: Diagnosis not present

## 2021-05-03 DIAGNOSIS — M5033 Other cervical disc degeneration, cervicothoracic region: Secondary | ICD-10-CM | POA: Diagnosis not present

## 2021-05-04 DIAGNOSIS — M5033 Other cervical disc degeneration, cervicothoracic region: Secondary | ICD-10-CM | POA: Diagnosis not present

## 2021-05-04 DIAGNOSIS — M9901 Segmental and somatic dysfunction of cervical region: Secondary | ICD-10-CM | POA: Diagnosis not present

## 2021-05-04 DIAGNOSIS — M5134 Other intervertebral disc degeneration, thoracic region: Secondary | ICD-10-CM | POA: Diagnosis not present

## 2021-05-04 DIAGNOSIS — M9903 Segmental and somatic dysfunction of lumbar region: Secondary | ICD-10-CM | POA: Diagnosis not present

## 2021-05-08 DIAGNOSIS — M5134 Other intervertebral disc degeneration, thoracic region: Secondary | ICD-10-CM | POA: Diagnosis not present

## 2021-05-08 DIAGNOSIS — M9903 Segmental and somatic dysfunction of lumbar region: Secondary | ICD-10-CM | POA: Diagnosis not present

## 2021-05-08 DIAGNOSIS — M5033 Other cervical disc degeneration, cervicothoracic region: Secondary | ICD-10-CM | POA: Diagnosis not present

## 2021-05-08 DIAGNOSIS — M9901 Segmental and somatic dysfunction of cervical region: Secondary | ICD-10-CM | POA: Diagnosis not present

## 2021-05-10 DIAGNOSIS — M9903 Segmental and somatic dysfunction of lumbar region: Secondary | ICD-10-CM | POA: Diagnosis not present

## 2021-05-10 DIAGNOSIS — M9901 Segmental and somatic dysfunction of cervical region: Secondary | ICD-10-CM | POA: Diagnosis not present

## 2021-05-10 DIAGNOSIS — M5134 Other intervertebral disc degeneration, thoracic region: Secondary | ICD-10-CM | POA: Diagnosis not present

## 2021-05-10 DIAGNOSIS — M5033 Other cervical disc degeneration, cervicothoracic region: Secondary | ICD-10-CM | POA: Diagnosis not present

## 2021-05-11 DIAGNOSIS — M9903 Segmental and somatic dysfunction of lumbar region: Secondary | ICD-10-CM | POA: Diagnosis not present

## 2021-05-11 DIAGNOSIS — M5033 Other cervical disc degeneration, cervicothoracic region: Secondary | ICD-10-CM | POA: Diagnosis not present

## 2021-05-11 DIAGNOSIS — M5134 Other intervertebral disc degeneration, thoracic region: Secondary | ICD-10-CM | POA: Diagnosis not present

## 2021-05-11 DIAGNOSIS — M9901 Segmental and somatic dysfunction of cervical region: Secondary | ICD-10-CM | POA: Diagnosis not present

## 2021-05-15 DIAGNOSIS — M5134 Other intervertebral disc degeneration, thoracic region: Secondary | ICD-10-CM | POA: Diagnosis not present

## 2021-05-15 DIAGNOSIS — M9901 Segmental and somatic dysfunction of cervical region: Secondary | ICD-10-CM | POA: Diagnosis not present

## 2021-05-15 DIAGNOSIS — M5033 Other cervical disc degeneration, cervicothoracic region: Secondary | ICD-10-CM | POA: Diagnosis not present

## 2021-05-15 DIAGNOSIS — M9903 Segmental and somatic dysfunction of lumbar region: Secondary | ICD-10-CM | POA: Diagnosis not present

## 2021-05-18 DIAGNOSIS — M5134 Other intervertebral disc degeneration, thoracic region: Secondary | ICD-10-CM | POA: Diagnosis not present

## 2021-05-18 DIAGNOSIS — M9903 Segmental and somatic dysfunction of lumbar region: Secondary | ICD-10-CM | POA: Diagnosis not present

## 2021-05-18 DIAGNOSIS — M5033 Other cervical disc degeneration, cervicothoracic region: Secondary | ICD-10-CM | POA: Diagnosis not present

## 2021-05-18 DIAGNOSIS — M9901 Segmental and somatic dysfunction of cervical region: Secondary | ICD-10-CM | POA: Diagnosis not present

## 2021-05-22 DIAGNOSIS — M9903 Segmental and somatic dysfunction of lumbar region: Secondary | ICD-10-CM | POA: Diagnosis not present

## 2021-05-22 DIAGNOSIS — M9901 Segmental and somatic dysfunction of cervical region: Secondary | ICD-10-CM | POA: Diagnosis not present

## 2021-05-22 DIAGNOSIS — M5134 Other intervertebral disc degeneration, thoracic region: Secondary | ICD-10-CM | POA: Diagnosis not present

## 2021-05-22 DIAGNOSIS — M5033 Other cervical disc degeneration, cervicothoracic region: Secondary | ICD-10-CM | POA: Diagnosis not present

## 2021-05-24 DIAGNOSIS — M5033 Other cervical disc degeneration, cervicothoracic region: Secondary | ICD-10-CM | POA: Diagnosis not present

## 2021-05-24 DIAGNOSIS — M9901 Segmental and somatic dysfunction of cervical region: Secondary | ICD-10-CM | POA: Diagnosis not present

## 2021-05-24 DIAGNOSIS — M9903 Segmental and somatic dysfunction of lumbar region: Secondary | ICD-10-CM | POA: Diagnosis not present

## 2021-05-24 DIAGNOSIS — M5134 Other intervertebral disc degeneration, thoracic region: Secondary | ICD-10-CM | POA: Diagnosis not present

## 2021-05-25 DIAGNOSIS — M9901 Segmental and somatic dysfunction of cervical region: Secondary | ICD-10-CM | POA: Diagnosis not present

## 2021-05-25 DIAGNOSIS — M5134 Other intervertebral disc degeneration, thoracic region: Secondary | ICD-10-CM | POA: Diagnosis not present

## 2021-05-25 DIAGNOSIS — M5033 Other cervical disc degeneration, cervicothoracic region: Secondary | ICD-10-CM | POA: Diagnosis not present

## 2021-05-25 DIAGNOSIS — M9903 Segmental and somatic dysfunction of lumbar region: Secondary | ICD-10-CM | POA: Diagnosis not present

## 2021-05-31 DIAGNOSIS — M9903 Segmental and somatic dysfunction of lumbar region: Secondary | ICD-10-CM | POA: Diagnosis not present

## 2021-05-31 DIAGNOSIS — M5134 Other intervertebral disc degeneration, thoracic region: Secondary | ICD-10-CM | POA: Diagnosis not present

## 2021-05-31 DIAGNOSIS — M5033 Other cervical disc degeneration, cervicothoracic region: Secondary | ICD-10-CM | POA: Diagnosis not present

## 2021-05-31 DIAGNOSIS — M9901 Segmental and somatic dysfunction of cervical region: Secondary | ICD-10-CM | POA: Diagnosis not present

## 2021-06-01 DIAGNOSIS — M5134 Other intervertebral disc degeneration, thoracic region: Secondary | ICD-10-CM | POA: Diagnosis not present

## 2021-06-01 DIAGNOSIS — M9901 Segmental and somatic dysfunction of cervical region: Secondary | ICD-10-CM | POA: Diagnosis not present

## 2021-06-01 DIAGNOSIS — M9903 Segmental and somatic dysfunction of lumbar region: Secondary | ICD-10-CM | POA: Diagnosis not present

## 2021-06-01 DIAGNOSIS — M5033 Other cervical disc degeneration, cervicothoracic region: Secondary | ICD-10-CM | POA: Diagnosis not present

## 2021-06-05 DIAGNOSIS — M9903 Segmental and somatic dysfunction of lumbar region: Secondary | ICD-10-CM | POA: Diagnosis not present

## 2021-06-05 DIAGNOSIS — M5033 Other cervical disc degeneration, cervicothoracic region: Secondary | ICD-10-CM | POA: Diagnosis not present

## 2021-06-05 DIAGNOSIS — M5134 Other intervertebral disc degeneration, thoracic region: Secondary | ICD-10-CM | POA: Diagnosis not present

## 2021-06-05 DIAGNOSIS — M9901 Segmental and somatic dysfunction of cervical region: Secondary | ICD-10-CM | POA: Diagnosis not present

## 2021-06-07 DIAGNOSIS — M5033 Other cervical disc degeneration, cervicothoracic region: Secondary | ICD-10-CM | POA: Diagnosis not present

## 2021-06-07 DIAGNOSIS — M9901 Segmental and somatic dysfunction of cervical region: Secondary | ICD-10-CM | POA: Diagnosis not present

## 2021-06-07 DIAGNOSIS — M9903 Segmental and somatic dysfunction of lumbar region: Secondary | ICD-10-CM | POA: Diagnosis not present

## 2021-06-07 DIAGNOSIS — M5134 Other intervertebral disc degeneration, thoracic region: Secondary | ICD-10-CM | POA: Diagnosis not present

## 2021-06-08 DIAGNOSIS — M5134 Other intervertebral disc degeneration, thoracic region: Secondary | ICD-10-CM | POA: Diagnosis not present

## 2021-06-08 DIAGNOSIS — M5033 Other cervical disc degeneration, cervicothoracic region: Secondary | ICD-10-CM | POA: Diagnosis not present

## 2021-06-08 DIAGNOSIS — M9901 Segmental and somatic dysfunction of cervical region: Secondary | ICD-10-CM | POA: Diagnosis not present

## 2021-06-08 DIAGNOSIS — M9903 Segmental and somatic dysfunction of lumbar region: Secondary | ICD-10-CM | POA: Diagnosis not present

## 2021-08-08 DIAGNOSIS — I5020 Unspecified systolic (congestive) heart failure: Secondary | ICD-10-CM | POA: Diagnosis not present

## 2021-08-08 DIAGNOSIS — E785 Hyperlipidemia, unspecified: Secondary | ICD-10-CM | POA: Diagnosis not present

## 2021-08-08 DIAGNOSIS — I10 Essential (primary) hypertension: Secondary | ICD-10-CM | POA: Diagnosis not present

## 2021-08-08 DIAGNOSIS — I25118 Atherosclerotic heart disease of native coronary artery with other forms of angina pectoris: Secondary | ICD-10-CM | POA: Diagnosis not present

## 2021-10-07 DIAGNOSIS — Z79899 Other long term (current) drug therapy: Secondary | ICD-10-CM | POA: Diagnosis not present

## 2021-10-07 DIAGNOSIS — Z7982 Long term (current) use of aspirin: Secondary | ICD-10-CM | POA: Diagnosis not present

## 2021-10-07 DIAGNOSIS — I2510 Atherosclerotic heart disease of native coronary artery without angina pectoris: Secondary | ICD-10-CM | POA: Diagnosis not present

## 2021-10-07 DIAGNOSIS — I252 Old myocardial infarction: Secondary | ICD-10-CM | POA: Diagnosis not present

## 2021-10-07 DIAGNOSIS — Z87891 Personal history of nicotine dependence: Secondary | ICD-10-CM | POA: Diagnosis not present

## 2021-10-07 DIAGNOSIS — R9431 Abnormal electrocardiogram [ECG] [EKG]: Secondary | ICD-10-CM | POA: Diagnosis not present

## 2021-10-07 DIAGNOSIS — J45909 Unspecified asthma, uncomplicated: Secondary | ICD-10-CM | POA: Diagnosis not present

## 2021-10-07 DIAGNOSIS — Z20822 Contact with and (suspected) exposure to covid-19: Secondary | ICD-10-CM | POA: Diagnosis not present

## 2021-10-07 DIAGNOSIS — R0602 Shortness of breath: Secondary | ICD-10-CM | POA: Diagnosis not present

## 2021-10-07 DIAGNOSIS — I10 Essential (primary) hypertension: Secondary | ICD-10-CM | POA: Diagnosis not present

## 2021-10-07 DIAGNOSIS — Z885 Allergy status to narcotic agent status: Secondary | ICD-10-CM | POA: Diagnosis not present

## 2021-10-07 DIAGNOSIS — I517 Cardiomegaly: Secondary | ICD-10-CM | POA: Diagnosis not present

## 2021-10-07 DIAGNOSIS — I700 Atherosclerosis of aorta: Secondary | ICD-10-CM | POA: Diagnosis not present

## 2021-10-07 DIAGNOSIS — E279 Disorder of adrenal gland, unspecified: Secondary | ICD-10-CM | POA: Diagnosis not present

## 2021-10-08 DIAGNOSIS — E279 Disorder of adrenal gland, unspecified: Secondary | ICD-10-CM | POA: Diagnosis not present

## 2021-10-08 DIAGNOSIS — I2510 Atherosclerotic heart disease of native coronary artery without angina pectoris: Secondary | ICD-10-CM | POA: Diagnosis not present

## 2021-10-08 DIAGNOSIS — I700 Atherosclerosis of aorta: Secondary | ICD-10-CM | POA: Diagnosis not present

## 2021-10-08 DIAGNOSIS — R0602 Shortness of breath: Secondary | ICD-10-CM | POA: Diagnosis not present

## 2021-10-17 DIAGNOSIS — Z299 Encounter for prophylactic measures, unspecified: Secondary | ICD-10-CM | POA: Diagnosis not present

## 2021-10-17 DIAGNOSIS — Z87891 Personal history of nicotine dependence: Secondary | ICD-10-CM | POA: Diagnosis not present

## 2021-10-17 DIAGNOSIS — I10 Essential (primary) hypertension: Secondary | ICD-10-CM | POA: Diagnosis not present

## 2021-10-17 DIAGNOSIS — Z6833 Body mass index (BMI) 33.0-33.9, adult: Secondary | ICD-10-CM | POA: Diagnosis not present

## 2021-10-17 DIAGNOSIS — J45909 Unspecified asthma, uncomplicated: Secondary | ICD-10-CM | POA: Diagnosis not present

## 2021-10-17 DIAGNOSIS — I509 Heart failure, unspecified: Secondary | ICD-10-CM | POA: Diagnosis not present

## 2021-10-17 DIAGNOSIS — E7800 Pure hypercholesterolemia, unspecified: Secondary | ICD-10-CM | POA: Diagnosis not present

## 2021-10-17 DIAGNOSIS — D692 Other nonthrombocytopenic purpura: Secondary | ICD-10-CM | POA: Diagnosis not present

## 2021-11-03 DIAGNOSIS — E7800 Pure hypercholesterolemia, unspecified: Secondary | ICD-10-CM | POA: Diagnosis not present

## 2021-11-03 DIAGNOSIS — Z79899 Other long term (current) drug therapy: Secondary | ICD-10-CM | POA: Diagnosis not present

## 2021-11-03 DIAGNOSIS — E278 Other specified disorders of adrenal gland: Secondary | ICD-10-CM | POA: Diagnosis not present

## 2021-11-03 DIAGNOSIS — Z1331 Encounter for screening for depression: Secondary | ICD-10-CM | POA: Diagnosis not present

## 2021-11-03 DIAGNOSIS — I10 Essential (primary) hypertension: Secondary | ICD-10-CM | POA: Diagnosis not present

## 2021-11-03 DIAGNOSIS — Z299 Encounter for prophylactic measures, unspecified: Secondary | ICD-10-CM | POA: Diagnosis not present

## 2021-11-03 DIAGNOSIS — Z6834 Body mass index (BMI) 34.0-34.9, adult: Secondary | ICD-10-CM | POA: Diagnosis not present

## 2021-11-03 DIAGNOSIS — Z0000 Encounter for general adult medical examination without abnormal findings: Secondary | ICD-10-CM | POA: Diagnosis not present

## 2021-11-09 DIAGNOSIS — Z79899 Other long term (current) drug therapy: Secondary | ICD-10-CM | POA: Diagnosis not present

## 2021-11-09 DIAGNOSIS — Z0000 Encounter for general adult medical examination without abnormal findings: Secondary | ICD-10-CM | POA: Diagnosis not present

## 2021-11-09 DIAGNOSIS — Z125 Encounter for screening for malignant neoplasm of prostate: Secondary | ICD-10-CM | POA: Diagnosis not present

## 2021-11-09 DIAGNOSIS — E7800 Pure hypercholesterolemia, unspecified: Secondary | ICD-10-CM | POA: Diagnosis not present

## 2022-02-05 DIAGNOSIS — I10 Essential (primary) hypertension: Secondary | ICD-10-CM | POA: Diagnosis not present

## 2022-02-05 DIAGNOSIS — I700 Atherosclerosis of aorta: Secondary | ICD-10-CM | POA: Diagnosis not present

## 2022-02-05 DIAGNOSIS — Z6834 Body mass index (BMI) 34.0-34.9, adult: Secondary | ICD-10-CM | POA: Diagnosis not present

## 2022-02-05 DIAGNOSIS — D539 Nutritional anemia, unspecified: Secondary | ICD-10-CM | POA: Diagnosis not present

## 2022-02-05 DIAGNOSIS — D692 Other nonthrombocytopenic purpura: Secondary | ICD-10-CM | POA: Diagnosis not present

## 2022-02-05 DIAGNOSIS — Z299 Encounter for prophylactic measures, unspecified: Secondary | ICD-10-CM | POA: Diagnosis not present

## 2022-02-06 DIAGNOSIS — E785 Hyperlipidemia, unspecified: Secondary | ICD-10-CM | POA: Diagnosis not present

## 2022-02-06 DIAGNOSIS — I25118 Atherosclerotic heart disease of native coronary artery with other forms of angina pectoris: Secondary | ICD-10-CM | POA: Diagnosis not present

## 2022-02-06 DIAGNOSIS — E6609 Other obesity due to excess calories: Secondary | ICD-10-CM | POA: Diagnosis not present

## 2022-02-06 DIAGNOSIS — I5020 Unspecified systolic (congestive) heart failure: Secondary | ICD-10-CM | POA: Diagnosis not present

## 2022-02-07 DIAGNOSIS — I509 Heart failure, unspecified: Secondary | ICD-10-CM | POA: Diagnosis not present

## 2022-02-07 DIAGNOSIS — I10 Essential (primary) hypertension: Secondary | ICD-10-CM | POA: Diagnosis not present

## 2022-02-07 DIAGNOSIS — E278 Other specified disorders of adrenal gland: Secondary | ICD-10-CM | POA: Diagnosis not present

## 2022-02-07 DIAGNOSIS — Z299 Encounter for prophylactic measures, unspecified: Secondary | ICD-10-CM | POA: Diagnosis not present

## 2022-03-06 ENCOUNTER — Ambulatory Visit: Payer: Self-pay | Admitting: "Endocrinology

## 2022-03-06 DIAGNOSIS — I5020 Unspecified systolic (congestive) heart failure: Secondary | ICD-10-CM | POA: Diagnosis not present

## 2022-03-06 DIAGNOSIS — E785 Hyperlipidemia, unspecified: Secondary | ICD-10-CM | POA: Diagnosis not present

## 2022-03-06 DIAGNOSIS — Z1211 Encounter for screening for malignant neoplasm of colon: Secondary | ICD-10-CM | POA: Diagnosis not present

## 2022-03-19 ENCOUNTER — Ambulatory Visit: Payer: Self-pay | Admitting: "Endocrinology

## 2022-03-30 DIAGNOSIS — D123 Benign neoplasm of transverse colon: Secondary | ICD-10-CM | POA: Diagnosis not present

## 2022-03-30 DIAGNOSIS — Z87891 Personal history of nicotine dependence: Secondary | ICD-10-CM | POA: Diagnosis not present

## 2022-03-30 DIAGNOSIS — K5730 Diverticulosis of large intestine without perforation or abscess without bleeding: Secondary | ICD-10-CM | POA: Diagnosis not present

## 2022-03-30 DIAGNOSIS — D127 Benign neoplasm of rectosigmoid junction: Secondary | ICD-10-CM | POA: Diagnosis not present

## 2022-03-30 DIAGNOSIS — D122 Benign neoplasm of ascending colon: Secondary | ICD-10-CM | POA: Diagnosis not present

## 2022-03-30 DIAGNOSIS — K635 Polyp of colon: Secondary | ICD-10-CM | POA: Diagnosis not present

## 2022-03-30 DIAGNOSIS — Z7902 Long term (current) use of antithrombotics/antiplatelets: Secondary | ICD-10-CM | POA: Diagnosis not present

## 2022-03-30 DIAGNOSIS — I2699 Other pulmonary embolism without acute cor pulmonale: Secondary | ICD-10-CM | POA: Diagnosis not present

## 2022-03-30 DIAGNOSIS — Z7982 Long term (current) use of aspirin: Secondary | ICD-10-CM | POA: Diagnosis not present

## 2022-03-30 DIAGNOSIS — Z1211 Encounter for screening for malignant neoplasm of colon: Secondary | ICD-10-CM | POA: Diagnosis not present

## 2022-03-30 DIAGNOSIS — I10 Essential (primary) hypertension: Secondary | ICD-10-CM | POA: Diagnosis not present

## 2022-03-30 DIAGNOSIS — I252 Old myocardial infarction: Secondary | ICD-10-CM | POA: Diagnosis not present

## 2022-03-30 DIAGNOSIS — I2510 Atherosclerotic heart disease of native coronary artery without angina pectoris: Secondary | ICD-10-CM | POA: Diagnosis not present

## 2022-03-30 DIAGNOSIS — R0602 Shortness of breath: Secondary | ICD-10-CM | POA: Diagnosis not present

## 2022-04-12 DIAGNOSIS — D369 Benign neoplasm, unspecified site: Secondary | ICD-10-CM | POA: Diagnosis not present

## 2022-05-15 ENCOUNTER — Encounter: Payer: Self-pay | Admitting: "Endocrinology

## 2022-05-15 ENCOUNTER — Ambulatory Visit: Payer: BC Managed Care – PPO | Admitting: "Endocrinology

## 2022-05-15 VITALS — BP 150/80 | HR 72 | Ht 68.0 in | Wt 222.2 lb

## 2022-05-15 DIAGNOSIS — E278 Other specified disorders of adrenal gland: Secondary | ICD-10-CM

## 2022-05-15 DIAGNOSIS — I10 Essential (primary) hypertension: Secondary | ICD-10-CM | POA: Diagnosis not present

## 2022-05-15 DIAGNOSIS — E782 Mixed hyperlipidemia: Secondary | ICD-10-CM | POA: Insufficient documentation
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
## 2022-05-16 LAB — COMPREHENSIVE METABOLIC PANEL
ALT: 19 IU/L (ref 0–44)
AST: 29 IU/L (ref 0–40)
Albumin/Globulin Ratio: 2 (ref 1.2–2.2)
Albumin: 4.5 g/dL (ref 3.9–4.9)
Alkaline Phosphatase: 72 IU/L (ref 44–121)
BUN/Creatinine Ratio: 13 (ref 10–24)
BUN: 9 mg/dL (ref 8–27)
Bilirubin Total: 0.5 mg/dL (ref 0.0–1.2)
CO2: 25 mmol/L (ref 20–29)
Calcium: 9.7 mg/dL (ref 8.6–10.2)
Chloride: 100 mmol/L (ref 96–106)
Creatinine, Ser: 0.72 mg/dL — ABNORMAL LOW (ref 0.76–1.27)
Globulin, Total: 2.3 g/dL (ref 1.5–4.5)
Glucose: 99 mg/dL (ref 70–99)
Potassium: 4.9 mmol/L (ref 3.5–5.2)
Sodium: 143 mmol/L (ref 134–144)
Total Protein: 6.8 g/dL (ref 6.0–8.5)
eGFR: 103 mL/min/{1.73_m2} (ref >59)

## 2022-05-16 LAB — LIPID PANEL
Chol/HDL Ratio: 2.1 ratio (ref 0.0–5.0)
Cholesterol, Total: 202 mg/dL — ABNORMAL HIGH (ref 100–199)
HDL: 95 mg/dL (ref >39)
LDL Chol Calc (NIH): 82 mg/dL (ref 0–99)
Triglycerides: 148 mg/dL (ref 0–149)
VLDL Cholesterol Cal: 25 mg/dL (ref 5–40)

## 2022-05-16 LAB — VITAMIN D 25 HYDROXY (VIT D DEFICIENCY, FRACTURES): Vit D, 25-Hydroxy: 13.3 ng/mL — ABNORMAL LOW (ref 30.0–100.0)

## 2022-05-31 ENCOUNTER — Ambulatory Visit: Payer: BC Managed Care – PPO | Admitting: "Endocrinology

## 2022-07-03 ENCOUNTER — Ambulatory Visit: Payer: BC Managed Care – PPO | Admitting: "Endocrinology

## 2022-08-09 ENCOUNTER — Ambulatory Visit: Payer: BC Managed Care – PPO | Admitting: "Endocrinology

## 2022-08-13 ENCOUNTER — Encounter (HOSPITAL_COMMUNITY): Payer: Self-pay

## 2022-08-13 ENCOUNTER — Inpatient Hospital Stay (HOSPITAL_COMMUNITY)
Admit: 2022-08-13 | Discharge: 2022-08-16 | DRG: 871 | Disposition: A | Discharge status: Home / Self Care | Payer: BC Managed Care – PPO | Source: Other Acute Inpatient Hospital | Attending: Family Medicine | Admitting: Family Medicine

## 2022-08-13 DIAGNOSIS — F419 Anxiety disorder, unspecified: Secondary | ICD-10-CM | POA: Diagnosis present

## 2022-08-13 DIAGNOSIS — E861 Hypovolemia: Secondary | ICD-10-CM | POA: Diagnosis present

## 2022-08-13 DIAGNOSIS — D72829 Elevated white blood cell count, unspecified: Secondary | ICD-10-CM | POA: Diagnosis present

## 2022-08-13 DIAGNOSIS — K7200 Acute and subacute hepatic failure without coma: Secondary | ICD-10-CM | POA: Diagnosis present

## 2022-08-13 DIAGNOSIS — M6282 Rhabdomyolysis: Secondary | ICD-10-CM | POA: Diagnosis present

## 2022-08-13 DIAGNOSIS — R7401 Elevation of levels of liver transaminase levels: Secondary | ICD-10-CM | POA: Diagnosis not present

## 2022-08-13 DIAGNOSIS — F32A Depression, unspecified: Secondary | ICD-10-CM | POA: Diagnosis present

## 2022-08-13 DIAGNOSIS — M47812 Spondylosis without myelopathy or radiculopathy, cervical region: Secondary | ICD-10-CM | POA: Diagnosis present

## 2022-08-13 DIAGNOSIS — I2489 Other forms of acute ischemic heart disease: Secondary | ICD-10-CM | POA: Diagnosis present

## 2022-08-13 DIAGNOSIS — T45525A Adverse effect of antithrombotic drugs, initial encounter: Secondary | ICD-10-CM | POA: Diagnosis present

## 2022-08-13 DIAGNOSIS — S0083XA Contusion of other part of head, initial encounter: Secondary | ICD-10-CM | POA: Diagnosis present

## 2022-08-13 DIAGNOSIS — R571 Hypovolemic shock: Secondary | ICD-10-CM | POA: Diagnosis present

## 2022-08-13 DIAGNOSIS — Z885 Allergy status to narcotic agent status: Secondary | ICD-10-CM

## 2022-08-13 DIAGNOSIS — I429 Cardiomyopathy, unspecified: Secondary | ICD-10-CM | POA: Diagnosis present

## 2022-08-13 DIAGNOSIS — D62 Acute posthemorrhagic anemia: Secondary | ICD-10-CM | POA: Diagnosis present

## 2022-08-13 DIAGNOSIS — Z825 Family history of asthma and other chronic lower respiratory diseases: Secondary | ICD-10-CM

## 2022-08-13 DIAGNOSIS — N179 Acute kidney failure, unspecified: Secondary | ICD-10-CM | POA: Diagnosis present

## 2022-08-13 DIAGNOSIS — I959 Hypotension, unspecified: Secondary | ICD-10-CM | POA: Diagnosis present

## 2022-08-13 DIAGNOSIS — J45909 Unspecified asthma, uncomplicated: Secondary | ICD-10-CM | POA: Diagnosis present

## 2022-08-13 DIAGNOSIS — I509 Heart failure, unspecified: Secondary | ICD-10-CM | POA: Diagnosis not present

## 2022-08-13 DIAGNOSIS — E872 Acidosis, unspecified: Secondary | ICD-10-CM | POA: Diagnosis present

## 2022-08-13 DIAGNOSIS — R578 Other shock: Principal | ICD-10-CM | POA: Diagnosis present

## 2022-08-13 DIAGNOSIS — R7989 Other specified abnormal findings of blood chemistry: Secondary | ICD-10-CM | POA: Diagnosis not present

## 2022-08-13 DIAGNOSIS — I110 Hypertensive heart disease with heart failure: Secondary | ICD-10-CM | POA: Diagnosis present

## 2022-08-13 DIAGNOSIS — Z23 Encounter for immunization: Secondary | ICD-10-CM

## 2022-08-13 DIAGNOSIS — Z87891 Personal history of nicotine dependence: Secondary | ICD-10-CM

## 2022-08-13 DIAGNOSIS — Z888 Allergy status to other drugs, medicaments and biological substances status: Secondary | ICD-10-CM

## 2022-08-13 DIAGNOSIS — E785 Hyperlipidemia, unspecified: Secondary | ICD-10-CM | POA: Diagnosis present

## 2022-08-13 DIAGNOSIS — Z8249 Family history of ischemic heart disease and other diseases of the circulatory system: Secondary | ICD-10-CM

## 2022-08-13 DIAGNOSIS — R296 Repeated falls: Secondary | ICD-10-CM | POA: Diagnosis present

## 2022-08-13 DIAGNOSIS — Y93E1 Activity, personal bathing and showering: Secondary | ICD-10-CM

## 2022-08-13 DIAGNOSIS — R0902 Hypoxemia: Secondary | ICD-10-CM | POA: Diagnosis present

## 2022-08-13 DIAGNOSIS — W19XXXA Unspecified fall, initial encounter: Secondary | ICD-10-CM | POA: Diagnosis not present

## 2022-08-13 DIAGNOSIS — W182XXA Fall in (into) shower or empty bathtub, initial encounter: Secondary | ICD-10-CM | POA: Diagnosis present

## 2022-08-13 DIAGNOSIS — D6832 Hemorrhagic disorder due to extrinsic circulating anticoagulants: Secondary | ICD-10-CM | POA: Diagnosis present

## 2022-08-13 DIAGNOSIS — I2510 Atherosclerotic heart disease of native coronary artery without angina pectoris: Secondary | ICD-10-CM | POA: Diagnosis present

## 2022-08-13 DIAGNOSIS — I5032 Chronic diastolic (congestive) heart failure: Secondary | ICD-10-CM | POA: Diagnosis present

## 2022-08-13 DIAGNOSIS — Z7902 Long term (current) use of antithrombotics/antiplatelets: Secondary | ICD-10-CM

## 2022-08-13 DIAGNOSIS — Y92009 Unspecified place in unspecified non-institutional (private) residence as the place of occurrence of the external cause: Secondary | ICD-10-CM | POA: Diagnosis not present

## 2022-08-13 DIAGNOSIS — T796XXA Traumatic ischemia of muscle, initial encounter: Secondary | ICD-10-CM | POA: Diagnosis not present

## 2022-08-13 DIAGNOSIS — S0181XA Laceration without foreign body of other part of head, initial encounter: Secondary | ICD-10-CM | POA: Diagnosis present

## 2022-08-13 DIAGNOSIS — T148XXA Other injury of unspecified body region, initial encounter: Secondary | ICD-10-CM | POA: Diagnosis present

## 2022-08-13 DIAGNOSIS — Z7982 Long term (current) use of aspirin: Secondary | ICD-10-CM

## 2022-08-13 DIAGNOSIS — Z9181 History of falling: Secondary | ICD-10-CM

## 2022-08-13 DIAGNOSIS — Z833 Family history of diabetes mellitus: Secondary | ICD-10-CM

## 2022-08-13 DIAGNOSIS — I9589 Other hypotension: Secondary | ICD-10-CM | POA: Diagnosis not present

## 2022-08-13 DIAGNOSIS — Z955 Presence of coronary angioplasty implant and graft: Secondary | ICD-10-CM

## 2022-08-13 DIAGNOSIS — Z79899 Other long term (current) drug therapy: Secondary | ICD-10-CM

## 2022-08-13 DIAGNOSIS — I252 Old myocardial infarction: Secondary | ICD-10-CM

## 2022-08-13 MED ORDER — SODIUM CHLORIDE 0.9 % IV BOLUS
600.0000 mL | Freq: Once | INTRAVENOUS | Status: AC
  Administered 2022-08-13: 600 mL via INTRAVENOUS

## 2022-08-13 MED ORDER — STERILE WATER FOR INJECTION IV SOLN
INTRAVENOUS | Status: DC
  Filled 2022-08-13 (×2): qty 150
  Filled 2022-08-13 (×2): qty 1000

## 2022-08-13 MED ORDER — SODIUM CHLORIDE 0.9% FLUSH
10.0000 mL | Freq: Two times a day (BID) | INTRAVENOUS | Status: DC
  Administered 2022-08-14 – 2022-08-16 (×6): 10 mL

## 2022-08-13 MED ORDER — CHLORHEXIDINE GLUCONATE CLOTH 2 % EX PADS
6.0000 | MEDICATED_PAD | Freq: Every day | CUTANEOUS | Status: DC
  Administered 2022-08-14 – 2022-08-16 (×3): 6 via TOPICAL

## 2022-08-13 MED ORDER — SODIUM CHLORIDE 0.9 % IV SOLN
INTRAVENOUS | Status: DC
Start: 2022-08-14 — End: 2022-08-13

## 2022-08-13 MED ORDER — ONDANSETRON HCL 4 MG/2ML IJ SOLN: 4.0000 mg | Freq: Four times a day (QID) | INTRAMUSCULAR | Status: DC | PRN

## 2022-08-13 MED ORDER — SODIUM CHLORIDE 0.9 % IV SOLN: 250.0000 mL | INTRAVENOUS | Status: DC

## 2022-08-13 MED ORDER — POLYETHYLENE GLYCOL 3350 17 G PO PACK: 17.0000 g | PACK | Freq: Every day | ORAL | Status: DC | PRN

## 2022-08-13 MED ORDER — DOCUSATE SODIUM 100 MG PO CAPS: 100.0000 mg | ORAL_CAPSULE | Freq: Two times a day (BID) | ORAL | Status: DC | PRN

## 2022-08-13 MED ORDER — HEPARIN SODIUM (PORCINE) 5000 UNIT/ML IJ SOLN: 5000.0000 [IU] | Freq: Three times a day (TID) | INTRAMUSCULAR | Status: DC

## 2022-08-13 MED ORDER — NOREPINEPHRINE 4 MG/250ML-% IV SOLN
2.0000 ug/min | INTRAVENOUS | Status: DC
  Administered 2022-08-13: 2 ug/min via INTRAVENOUS
  Administered 2022-08-14: 9 ug/min via INTRAVENOUS
  Filled 2022-08-13 (×2): qty 250

## 2022-08-13 NOTE — Progress Notes (Signed)
PT. Has TL R Fem CVC.
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
## 2022-08-13 NOTE — Progress Notes (Addendum)
eLink Physician-Brief Progress Note Patient Name: Dean Wang DOB: October 31, 1960 MRN: [id number]   Date of Service  08/13/2022  HPI/Events of Note  62 year old male brought in by EMS for evaluation of a fall from last night in the shower.  Outside BMP shows anion gap metabolic acidosis with a bicarb of 12.  Creatinine is elevated to 2.67.  Alcohol level 50.  Leukocytosis of 15.2 with a hemoglobin of 9, MCV 110, and platelets 335.  CT cervical spine with 2 mm grade 1 spondylosis at C3-C4 and C7-T1.  CT head with no acute intracranial abnormalities.CT chest abdomen pelvis with no acute traumatic findings.  Previous rib fractures.  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  Calcifications in the right middle lobe.  Right femoral CVC is in place running norepinephrine.  eICU Interventions  He appears to be mentating well, has no acute concerns.  Had some back pain which was alleviated with morphine at the outside hospital.  Will readdress if this happens again.  No obvious signs or symptoms of active bleeding, or evidence of acute infection.  No indication for GI prophylaxis.  Will order heparin subcutaneous for DVT prophylaxis.  Labs at our hospital are pending.  Agree with fluids with bicarb, ongoing nor epi.  No immediate interventions are necessary.    0217 -hemoglobin returned back at 5.9, down from outside check of 9.  No clear bleeding.  Will discontinue heparin subcutaneous for now, transfused 2 units PRBC, trend hemoglobin every 6 hours.  Outside imaging reviewed, no obvious internal bleeding noted either. 43 -notified about numerous abnormalities on patient's BMP, tropes, CK.  CK is only trended up mildly.  Initial EKG in the system not uploaded but shows history of anterior infarct and sinus rhythm.  Repeat EKG pending.  Troponin is disproportionately elevated at 23,000 and, LFTs are elevated.  I am wondering if the patient is having acute cardiac event leading to his remaining  findings.  Creatinine is elevated.  Will consult cardiology.  Outside imaging shows no aneurysm on noncontrast study.  Patient is having no chest or abdominal pain.  Trend tropes, trend EKG  Intervention Category Evaluation Type: New Patient Evaluation  Malan Werk 08/13/2022, 11:43 PM

## 2022-08-14 ENCOUNTER — Inpatient Hospital Stay (HOSPITAL_COMMUNITY): Payer: BC Managed Care – PPO

## 2022-08-14 DIAGNOSIS — I509 Heart failure, unspecified: Secondary | ICD-10-CM

## 2022-08-14 DIAGNOSIS — E861 Hypovolemia: Secondary | ICD-10-CM

## 2022-08-14 DIAGNOSIS — I9589 Other hypotension: Secondary | ICD-10-CM

## 2022-08-14 DIAGNOSIS — R7989 Other specified abnormal findings of blood chemistry: Secondary | ICD-10-CM | POA: Diagnosis not present

## 2022-08-14 DIAGNOSIS — T796XXA Traumatic ischemia of muscle, initial encounter: Secondary | ICD-10-CM

## 2022-08-14 DIAGNOSIS — N179 Acute kidney failure, unspecified: Secondary | ICD-10-CM | POA: Diagnosis not present

## 2022-08-14 DIAGNOSIS — R7401 Elevation of levels of liver transaminase levels: Secondary | ICD-10-CM | POA: Diagnosis not present

## 2022-08-14 LAB — TROPONIN I (HIGH SENSITIVITY)
Troponin I (High Sensitivity): 23487 ng/L (ref <18)
Troponin I (High Sensitivity): >24000 ng/L (ref <18)
Troponin I (High Sensitivity): 9506 ng/L (ref <18)

## 2022-08-14 LAB — TYPE AND SCREEN
ABO/RH(D): A POS
ABO/RH(D): A POS
Antibody Screen: NEGATIVE
Unit division: 0

## 2022-08-14 LAB — CBC
HCT: 17.8 % — ABNORMAL LOW (ref 39.0–52.0)
HCT: 20.8 % — ABNORMAL LOW (ref 39.0–52.0)
Hemoglobin: 5.9 g/dL — CL (ref 13.0–17.0)
Hemoglobin: 7.2 g/dL — ABNORMAL LOW (ref 13.0–17.0)
MCH: 35.5 pg — ABNORMAL HIGH (ref 26.0–34.0)
MCH: 33.6 pg (ref 26.0–34.0)
MCHC: 33.1 g/dL (ref 30.0–36.0)
MCHC: 34.6 g/dL (ref 30.0–36.0)
MCV: 107.2 fL — ABNORMAL HIGH (ref 80.0–100.0)
MCV: 97.2 fL (ref 80.0–100.0)
Platelets: 205 10*3/uL (ref 150–400)
Platelets: 157 10*3/uL (ref 150–400)
RBC: 1.66 MIL/uL — ABNORMAL LOW (ref 4.22–5.81)
RBC: 2.14 MIL/uL — ABNORMAL LOW (ref 4.22–5.81)
RDW: 13.2 % (ref 11.5–15.5)
RDW: 16.2 % — ABNORMAL HIGH (ref 11.5–15.5)
WBC: 13.3 10*3/uL — ABNORMAL HIGH (ref 4.0–10.5)
WBC: 11.1 10*3/uL — ABNORMAL HIGH (ref 4.0–10.5)
nRBC: 0 % (ref 0.0–0.2)
nRBC: 0 % (ref 0.0–0.2)

## 2022-08-14 LAB — CBC WITH DIFFERENTIAL/PLATELET
Abs Immature Granulocytes: 0.14 10*3/uL — ABNORMAL HIGH (ref 0.00–0.07)
Basophils Absolute: 0 10*3/uL (ref 0.0–0.1)
Basophils Relative: 0 %
Eosinophils Absolute: 0 10*3/uL (ref 0.0–0.5)
Eosinophils Relative: 0 %
HCT: 17.9 % — ABNORMAL LOW (ref 39.0–52.0)
Hemoglobin: 5.9 g/dL — CL (ref 13.0–17.0)
Immature Granulocytes: 1 %
Lymphocytes Relative: 11 %
Lymphs Abs: 1.5 10*3/uL (ref 0.7–4.0)
MCH: 35.5 pg — ABNORMAL HIGH (ref 26.0–34.0)
MCHC: 33 g/dL (ref 30.0–36.0)
MCV: 107.8 fL — ABNORMAL HIGH (ref 80.0–100.0)
Monocytes Absolute: 0.9 10*3/uL (ref 0.1–1.0)
Monocytes Relative: 6 %
Neutro Abs: 11 10*3/uL — ABNORMAL HIGH (ref 1.7–7.7)
Neutrophils Relative %: 82 %
Platelets: 205 10*3/uL (ref 150–400)
RBC: 1.66 MIL/uL — ABNORMAL LOW (ref 4.22–5.81)
RDW: 13.2 % (ref 11.5–15.5)
WBC: 13.5 10*3/uL — ABNORMAL HIGH (ref 4.0–10.5)
nRBC: 0 % (ref 0.0–0.2)

## 2022-08-14 LAB — PHOSPHORUS
Phosphorus: 7.5 mg/dL — ABNORMAL HIGH (ref 2.5–4.6)
Phosphorus: 7.3 mg/dL — ABNORMAL HIGH (ref 2.5–4.6)

## 2022-08-14 LAB — COMPREHENSIVE METABOLIC PANEL
ALT: 1059 U/L — ABNORMAL HIGH (ref 0–44)
ALT: 1073 U/L — ABNORMAL HIGH (ref 0–44)
AST: 4317 U/L — ABNORMAL HIGH (ref 15–41)
AST: 4791 U/L — ABNORMAL HIGH (ref 15–41)
Albumin: 2.5 g/dL — ABNORMAL LOW (ref 3.5–5.0)
Albumin: 2.6 g/dL — ABNORMAL LOW (ref 3.5–5.0)
Alkaline Phosphatase: 36 U/L — ABNORMAL LOW (ref 38–126)
Alkaline Phosphatase: 37 U/L — ABNORMAL LOW (ref 38–126)
Anion gap: 14 (ref 5–15)
Anion gap: 14 (ref 5–15)
BUN: 26 mg/dL — ABNORMAL HIGH (ref 8–23)
BUN: 31 mg/dL — ABNORMAL HIGH (ref 8–23)
CO2: 22 mmol/L (ref 22–32)
CO2: 24 mmol/L (ref 22–32)
Calcium: 5.9 mg/dL — CL (ref 8.9–10.3)
Calcium: 6 mg/dL — CL (ref 8.9–10.3)
Chloride: 97 mmol/L — ABNORMAL LOW (ref 98–111)
Chloride: 93 mmol/L — ABNORMAL LOW (ref 98–111)
Creatinine, Ser: 3.79 mg/dL — ABNORMAL HIGH (ref 0.61–1.24)
Creatinine, Ser: 3.87 mg/dL — ABNORMAL HIGH (ref 0.61–1.24)
GFR, Estimated: 17 mL/min — ABNORMAL LOW (ref >60)
GFR, Estimated: 17 mL/min — ABNORMAL LOW (ref >60)
Glucose, Bld: 114 mg/dL — ABNORMAL HIGH (ref 70–99)
Glucose, Bld: 127 mg/dL — ABNORMAL HIGH (ref 70–99)
Potassium: 4.9 mmol/L (ref 3.5–5.1)
Potassium: 4.1 mmol/L (ref 3.5–5.1)
Sodium: 133 mmol/L — ABNORMAL LOW (ref 135–145)
Sodium: 131 mmol/L — ABNORMAL LOW (ref 135–145)
Total Bilirubin: 0.6 mg/dL (ref 0.3–1.2)
Total Bilirubin: 0.9 mg/dL (ref 0.3–1.2)
Total Protein: 4.4 g/dL — ABNORMAL LOW (ref 6.5–8.1)
Total Protein: 4.4 g/dL — ABNORMAL LOW (ref 6.5–8.1)

## 2022-08-14 LAB — CK
Total CK: 1253 U/L — ABNORMAL HIGH (ref 49–397)
Total CK: 2333 U/L — ABNORMAL HIGH (ref 49–397)
Total CK: 1967 U/L — ABNORMAL HIGH (ref 49–397)

## 2022-08-14 LAB — PREPARE RBC (CROSSMATCH)

## 2022-08-14 LAB — MRSA NEXT GEN BY PCR, NASAL: MRSA by PCR Next Gen: NOT DETECTED

## 2022-08-14 LAB — BASIC METABOLIC PANEL
Anion gap: 13 (ref 5–15)
BUN: 29 mg/dL — ABNORMAL HIGH (ref 8–23)
CO2: 27 mmol/L (ref 22–32)
Calcium: 6.5 mg/dL — ABNORMAL LOW (ref 8.9–10.3)
Chloride: 94 mmol/L — ABNORMAL LOW (ref 98–111)
Creatinine, Ser: 2.28 mg/dL — ABNORMAL HIGH (ref 0.61–1.24)
GFR, Estimated: 31 mL/min — ABNORMAL LOW (ref >60)
Glucose, Bld: 145 mg/dL — ABNORMAL HIGH (ref 70–99)
Potassium: 3.2 mmol/L — ABNORMAL LOW (ref 3.5–5.1)
Sodium: 134 mmol/L — ABNORMAL LOW (ref 135–145)

## 2022-08-14 LAB — LACTIC ACID, PLASMA
Lactic Acid, Venous: 2.8 mmol/L (ref 0.5–1.9)
Lactic Acid, Venous: 1.5 mmol/L (ref 0.5–1.9)

## 2022-08-14 LAB — ECHOCARDIOGRAM COMPLETE
Area-P 1/2: 2.59 cm2
Calc EF: 60.6 %
Height: 68 in
S' Lateral: 3.7 cm
Single Plane A2C EF: 63.4 %
Single Plane A4C EF: 59.3 %
Weight: 3350.99 oz

## 2022-08-14 LAB — ABO/RH: ABO/RH(D): A POS

## 2022-08-14 LAB — MAGNESIUM
Magnesium: 1.7 mg/dL (ref 1.7–2.4)
Magnesium: 1.7 mg/dL (ref 1.7–2.4)

## 2022-08-14 LAB — PROTIME-INR
INR: 1.5 — ABNORMAL HIGH (ref 0.8–1.2)
Prothrombin Time: 18.2 seconds — ABNORMAL HIGH (ref 11.4–15.2)

## 2022-08-14 LAB — APTT: aPTT: 28 seconds (ref 24–36)

## 2022-08-14 LAB — HIV ANTIBODY (ROUTINE TESTING W REFLEX): HIV Screen 4th Generation wRfx: NONREACTIVE

## 2022-08-14 LAB — BRAIN NATRIURETIC PEPTIDE: B Natriuretic Peptide: 103.3 pg/mL — ABNORMAL HIGH (ref 0.0–100.0)

## 2022-08-14 MED ORDER — CALCIUM GLUCONATE-NACL 2-0.675 GM/100ML-% IV SOLN
2.0000 g | Freq: Once | INTRAVENOUS | Status: AC
  Administered 2022-08-14: 2000 mg via INTRAVENOUS
  Filled 2022-08-14: qty 100

## 2022-08-14 MED ORDER — SODIUM CHLORIDE 0.9% IV SOLUTION: Freq: Once | INTRAVENOUS | Status: AC

## 2022-08-14 MED ORDER — NOREPINEPHRINE 4 MG/250ML-% IV SOLN: 0.0000 ug/min | INTRAVENOUS | Status: DC

## 2022-08-14 MED ORDER — HEPARIN (PORCINE) 25000 UT/250ML-% IV SOLN
1150.0000 [IU]/h | INTRAVENOUS | Status: DC
  Administered 2022-08-14: 1150 [IU]/h via INTRAVENOUS
  Filled 2022-08-14: qty 250

## 2022-08-14 MED ORDER — ORAL CARE MOUTH RINSE: 15.0000 mL | OROMUCOSAL | Status: DC | PRN

## 2022-08-14 MED ORDER — LACTATED RINGERS IV BOLUS: 1000.0000 mL | Freq: Once | INTRAVENOUS | Status: AC

## 2022-08-14 MED ORDER — DEXTROSE 5 % IV SOLN
12.5000 mg/kg/h | INTRAVENOUS | Status: AC
  Administered 2022-08-14: 12.5 mg/kg/h via INTRAVENOUS
  Filled 2022-08-14: qty 90

## 2022-08-14 MED ORDER — PERFLUTREN LIPID MICROSPHERE
1.0000 mL | INTRAVENOUS | Status: AC | PRN
  Administered 2022-08-14: 4 mL via INTRAVENOUS

## 2022-08-14 MED ORDER — POTASSIUM CHLORIDE 20 MEQ PO PACK
60.0000 meq | PACK | Freq: Once | ORAL | Status: AC
  Administered 2022-08-14: 60 meq via ORAL
  Filled 2022-08-14: qty 3

## 2022-08-14 MED ORDER — DEXTROSE 5 % IV SOLN
6.2500 mg/kg/h | INTRAVENOUS | Status: DC
  Filled 2022-08-14 (×2): qty 90

## 2022-08-14 MED ORDER — ACETYLCYSTEINE LOAD VIA INFUSION
150.0000 mg/kg | Freq: Once | INTRAVENOUS | Status: AC
  Administered 2022-08-14: 14250 mg via INTRAVENOUS
  Filled 2022-08-14: qty 468

## 2022-08-14 NOTE — Progress Notes (Addendum)
eLink Physician-Brief Progress Note Patient Name: Dean Wang DOB: [birth year] MRN: [id number]   Date of Service  08/14/2022  HPI/Events of Note  62 year old male brought in by EMS for evaluation of a fall found to be in rhabdomyolysis.  Had elevated troponins greater than 24,000.  Echocardiogram without evidence of acute wall motion abnormalities.  Cardiology consulted and no plan for intervention.  monitor alarming, leads changed, showing changes in rhythm. Placed orders for troponin (am troponin >24,000) and bmp for electrolytes, stat ekg  eICU Interventions  ECG reviewed-unremarkable changes.  For now, continue observation.   2313 - potassium 3.2, Ca 6.5, cret 2.28, GFR 31 Troponin downtrending 9506.  CK downtrending.  KCl and calcium ordered.  Intervention Category Intermediate Interventions: Arrhythmia - evaluation and management  Mily Malecki 08/14/2022, 10:03 PM

## 2022-08-14 NOTE — Progress Notes (Signed)
Echocardiogram 2D Echocardiogram has been performed.  Toni Amend 08/14/2022, 3:00 PM

## 2022-08-14 NOTE — Progress Notes (Signed)
Cardiology called for evaluation, elevated troponin at >24,000.   Admitted from [facility] with fall and hypotension. In shock requiring vasopressors.  Tx to [facility]. Noted to have elevated lactate, rhabdo, AKI, anemia and transaminitis.  Question of intoxication vs syncopal episode leading to fall.  CT Head negative.     Dean Brim, MSN, APRN, NP-C, AGACNP-BC [facility] 08/14/2022, 8:27 AM   Please see [email or web address] for pager details.   From 7A-7P if no response, please call [phone] After hours, please call ELink [phone]
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
## 2022-08-14 NOTE — Progress Notes (Addendum)
MEDICATION RELATED CONSULT NOTE - INITIAL   Pharmacy Consult for Acetylcysteine Indication: acute liver failure (non-acetaminophen induced)  Allergies  Allergen Reactions   Codeine Nausea And Vomiting   Demerol [Meperidine Hcl] Nausea And Vomiting    Patient Measurements: Height:  (172.7 cm) Weight: 95 kg (209 lb 7 oz) IBW/kg (Calculated) : 68.4    Vital Signs: Temp: 98.5 F (36.9 C) (04/23 0434) Temp Source: Oral (04/23 0415) BP: 141/36 (04/23 0434) Pulse Rate: 81 (04/23 0434) Intake/Output from previous day: 04/22 0701 - 04/23 0700 In: 290.6 [I.V.:24.7; IV Piggyback:265.9] Out: -  Intake/Output from this shift: Total I/O In: 290.6 [I.V.:24.7; IV Piggyback:265.9] Out: -   Labs: Recent Labs    08/14/22 0115  WBC 13.3*  13.5*  HGB 5.9*  5.9*  HCT 17.8*  17.9*  PLT 205  205  APTT 28  CREATININE 3.79*  MG 1.7  1.7  PHOS 7.3*  7.5*  ALBUMIN 2.5*  PROT 4.4*  AST 4,317*  ALT 1,059*  ALKPHOS 36*  BILITOT 0.6   Estimated Creatinine Clearance: 22.3 mL/min (A) (by C-G formula based on SCr of 3.79 mg/dL (H)).   Microbiology: Recent Results (from the past 720 hour(s))  MRSA Next Gen by PCR, Nasal     Status: None   Collection Time: 08/14/22  4:25 AM   Specimen: Nasal Mucosa; Nasal Swab  Result Value Ref Range Status   MRSA by PCR Next Gen NOT DETECTED NOT DETECTED Final    Comment: (NOTE) The GeneXpert MRSA Assay (FDA approved for NASAL specimens only), is one component of a comprehensive MRSA colonization surveillance program. It is not intended to diagnose MRSA infection nor to guide or monitor treatment for MRSA infections. Test performance is not FDA approved in patients less than 77 years old. Performed at [facility], [address], [place], [place] [address]     Medical History: Past Medical History:  Diagnosis Date   Anxiety    Depression    Heart attack (HCC)    HTN (hypertension)      Assessment:  62 yr  male s/p fall with hypotension (4/21) reported hitting head with bleeding. TX from [facility] ED.  Patient admitted to ICU with rhabdo, lactic acidosis, acute blood loss anemia and hypotension (currently on pressors).   Goal of Therapy:  Normal AST/ALT  Plan:  Acetylcysteine /kg x 1 loading dose Acetylcysteine 12.5 mg/kg/hr x 4 hr then Acetylcysteine 6.25 mg/kg/hr x 67 hr Follow labs per MD  Osbaldo Mark, Joselyn Glassman, PharmD 08/14/2022,6:14 AM

## 2022-08-14 NOTE — Progress Notes (Signed)
ANTICOAGULATION CONSULT NOTE - Initial Consult  Pharmacy Consult for Heparin Indication: ACS/STEMI  Allergies  Allergen Reactions   Codeine Nausea And Vomiting   Demerol [Meperidine Hcl] Nausea And Vomiting    Patient Measurements: Height:  (172.7 cm) Weight: 95 kg (209 lb 7 oz) IBW/kg (Calculated) : 68.4 Heparin Dosing Weight: 88 kg  Vital Signs: Temp: 98.5 F (36.9 C) (04/23 0434) Temp Source: Oral (04/23 0415) BP: 141/36 (04/23 0434) Pulse Rate: 81 (04/23 0434)  Labs: Recent Labs    08/14/22 0115  HGB 5.9*  5.9*  HCT 17.8*  17.9*  PLT 205  205  APTT 28  LABPROT 18.2*  INR 1.5*  CREATININE 3.79*  CKTOTAL 1,253*  TROPONINIHS 23,487*    Estimated Creatinine Clearance: 22.3 mL/min (A) (by C-G formula based on SCr of 3.79 mg/dL (H)).   Medical History: Past Medical History:  Diagnosis Date   Anxiety    Depression    Heart attack (HCC)    HTN (hypertension)     Medications:  PTA Effient  daily  Assessment: 62 yr male s/p fall with hypotension (4/21) reported hitting head with bleeding.  TX from [facility] ED, where head CT - bleed.  PMH significant for CAD, CHF, HTN, HLD.  Patient admitted to ICU with rhabdo, lactic acidosis, acute blood loss anemia and hypotension (currently on pressors) Today patient received 2u PRBC. No bleeding noted Elevated troponin Baseline INR elevated = 1.5   Goal of Therapy:  Heparin level 0.3-0.7 units/ml Monitor platelets by anticoagulation protocol: Yes   Plan:  No initial bolus per discussion with Dr Delia Chimes due to Hgb 5.9 Begin IV heparin gtt @ 1150 units/hr Check heparin level 8 hr after heparin infusion started Daily heparin level & CBC  Miliyah Luper, Joselyn Glassman, PharmD 08/14/2022,5:18 AM

## 2022-08-14 NOTE — Progress Notes (Signed)
  Transition of Care [facility]) Screening Note   Patient Details  Name: Dean Wang Date of Birth: Oct 29, 1960   Transition of Care [facility]) CM/SW Contact:    Lavenia Atlas, RN Phone Number: 08/14/2022, 2:17 PM    Transition of Care Department [facility]) has reviewed patient and no TOC needs have been identified at this time. We will continue to monitor patient advancement through interdisciplinary progression rounds. If new patient transition needs arise, please place a TOC consult.

## 2022-08-15 ENCOUNTER — Encounter (HOSPITAL_COMMUNITY): Payer: Self-pay | Admitting: Critical Care Medicine

## 2022-08-15 ENCOUNTER — Other Ambulatory Visit: Payer: Self-pay

## 2022-08-15 DIAGNOSIS — E861 Hypovolemia: Secondary | ICD-10-CM | POA: Diagnosis not present

## 2022-08-15 DIAGNOSIS — R7989 Other specified abnormal findings of blood chemistry: Secondary | ICD-10-CM | POA: Diagnosis not present

## 2022-08-15 LAB — TYPE AND SCREEN
Antibody Screen: NEGATIVE
Unit division: 0

## 2022-08-15 LAB — BPAM RBC
Blood Product Expiration Date: 202405112359
Blood Product Expiration Date: 202405112359
ISSUE DATE / TIME: 202404230414
ISSUE DATE / TIME: 202404230926
Unit Type and Rh: 6200
Unit Type and Rh: 6200

## 2022-08-15 LAB — COMPREHENSIVE METABOLIC PANEL
ALT: 832 U/L — ABNORMAL HIGH (ref 0–44)
AST: 1956 U/L — ABNORMAL HIGH (ref 15–41)
Albumin: 2.4 g/dL — ABNORMAL LOW (ref 3.5–5.0)
Alkaline Phosphatase: 48 U/L (ref 38–126)
Anion gap: 9 (ref 5–15)
BUN: 23 mg/dL (ref 8–23)
CO2: 31 mmol/L (ref 22–32)
Calcium: 7 mg/dL — ABNORMAL LOW (ref 8.9–10.3)
Chloride: 97 mmol/L — ABNORMAL LOW (ref 98–111)
Creatinine, Ser: 1.68 mg/dL — ABNORMAL HIGH (ref 0.61–1.24)
GFR, Estimated: 45 mL/min — ABNORMAL LOW (ref >60)
Glucose, Bld: 121 mg/dL — ABNORMAL HIGH (ref 70–99)
Potassium: 3.4 mmol/L — ABNORMAL LOW (ref 3.5–5.1)
Sodium: 137 mmol/L (ref 135–145)
Total Bilirubin: 0.6 mg/dL (ref 0.3–1.2)
Total Protein: 4.6 g/dL — ABNORMAL LOW (ref 6.5–8.1)

## 2022-08-15 LAB — CBC
HCT: 21 % — ABNORMAL LOW (ref 39.0–52.0)
Hemoglobin: 7.1 g/dL — ABNORMAL LOW (ref 13.0–17.0)
MCH: 33 pg (ref 26.0–34.0)
MCHC: 33.8 g/dL (ref 30.0–36.0)
MCV: 97.7 fL (ref 80.0–100.0)
Platelets: 163 10*3/uL (ref 150–400)
RBC: 2.15 MIL/uL — ABNORMAL LOW (ref 4.22–5.81)
RDW: 17.2 % — ABNORMAL HIGH (ref 11.5–15.5)
WBC: 12.3 10*3/uL — ABNORMAL HIGH (ref 4.0–10.5)
nRBC: 0.2 % (ref 0.0–0.2)

## 2022-08-15 LAB — PHOSPHORUS: Phosphorus: 2.7 mg/dL (ref 2.5–4.6)

## 2022-08-15 LAB — MAGNESIUM: Magnesium: 1.7 mg/dL (ref 1.7–2.4)

## 2022-08-15 LAB — TSH: TSH: 1.817 u[IU]/mL (ref 0.350–4.500)

## 2022-08-15 LAB — CK: Total CK: 1502 U/L — ABNORMAL HIGH (ref 49–397)

## 2022-08-15 MED ORDER — POTASSIUM CHLORIDE CRYS ER 20 MEQ PO TBCR
40.0000 meq | EXTENDED_RELEASE_TABLET | Freq: Once | ORAL | Status: AC
  Administered 2022-08-15: 40 meq via ORAL
  Filled 2022-08-15: qty 2

## 2022-08-15 MED ORDER — AMLODIPINE BESYLATE 10 MG PO TABS
10.0000 mg | ORAL_TABLET | Freq: Every day | ORAL | Status: DC
  Administered 2022-08-15 – 2022-08-16 (×2): 10 mg via ORAL
  Filled 2022-08-15 (×2): qty 1

## 2022-08-15 MED ORDER — CALCIUM GLUCONATE-NACL 2-0.675 GM/100ML-% IV SOLN
2.0000 g | Freq: Once | INTRAVENOUS | Status: AC
  Administered 2022-08-15: 2000 mg via INTRAVENOUS
  Filled 2022-08-15: qty 100

## 2022-08-15 MED ORDER — METOPROLOL SUCCINATE ER 25 MG PO TB24
25.0000 mg | ORAL_TABLET | Freq: Every day | ORAL | Status: DC
  Administered 2022-08-15 – 2022-08-16 (×2): 25 mg via ORAL
  Filled 2022-08-15 (×2): qty 1

## 2022-08-15 MED ORDER — PNEUMOCOCCAL 20-VAL CONJ VACC 0.5 ML IM SUSY
0.5000 mL | PREFILLED_SYRINGE | INTRAMUSCULAR | Status: AC
  Administered 2022-08-16: 0.5 mL via INTRAMUSCULAR
  Filled 2022-08-15: qty 0.5

## 2022-08-15 MED ORDER — SODIUM CHLORIDE 0.9 % IV BOLUS: 500.0000 mL | Freq: Once | INTRAVENOUS | Status: DC

## 2022-08-15 MED ORDER — MAGNESIUM SULFATE 2 GM/50ML IV SOLN
2.0000 g | Freq: Once | INTRAVENOUS | Status: AC
  Administered 2022-08-15: 2 g via INTRAVENOUS
  Filled 2022-08-15: qty 50
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
## 2022-08-15 NOTE — Progress Notes (Addendum)
Rounding Note    Patient Name: Dean Wang Date of Encounter: 08/15/2022   HeartCare Cardiologist: [facility]  Subjective   No acute overnight events. Patient states he is feeling the best he has felt so far today. No complaints this morning. He denies any pain anywhere including his chest. No shortness of breath. He noticed some phlegm last night but states this is not unusual for him and usually improves with his inhalers. No palpitations. His appetite has improved.   Inpatient Medications    Scheduled Meds:  Chlorhexidine Gluconate Cloth  6 each Topical Daily   sodium chloride flush  10-40 mL Intracatheter Q12H   Continuous Infusions:  sodium chloride Stopped (08/14/22 0041)   acetylcysteine 6.25 mg/kg/hr (08/15/22 0700)   norepinephrine (LEVOPHED) Adult infusion Stopped (08/14/22 1005)   sodium bicarbonate 150 mEq in sterile water 1,150 mL infusion 100 mL/hr at 08/15/22 0700   PRN Meds: docusate sodium, ondansetron (ZOFRAN) IV, mouth rinse, polyethylene glycol   Vital Signs    Vitals:   08/15/22 0300 08/15/22 0400 08/15/22 0500 08/15/22 0600  BP: (!) 136/56 (!) 128/52 (!) 150/53 (!) 150/62  Pulse: 79 72 69 71  Resp: 17 16 17  (!) 21  Temp:  98.2 F (36.8 C)    TempSrc:  Axillary    SpO2: 99% 100% 99% 98%  Weight:   99.5 kg   Height:        Intake/Output Summary (Last 24 hours) at 08/15/2022 0752 Last data filed at 08/15/2022 0700 Gross per 24 hour  Intake 3792 ml  Output 4825 ml  Net -1033 ml      08/15/2022    5:00 AM 08/14/2022    7:03 AM 08/13/2022   10:24 PM  Last 3 Weights  Weight (lbs) 219 lb 5.7 oz 209 lb 7 oz 209 lb 7 oz  Weight (kg) 99.5 kg 95 kg 95 kg      Telemetry    Normal sinus rhythm rates in the 60s to 70s. Occasional runs of a wide complex QRS (aberrancy vs accelerated idioventricular rhythm) with rates in the high 90s.  - Personally Reviewed  ECG    No new ECG tracing today. - Personally Reviewed  Physical Exam    GEN: Caucasian male in no acute distress.   Neck: No JVD. Cardiac: RRR. No murmurs, rubs, or gallops.  Respiratory: Clear to auscultation bilaterally. No wheezes, rhonchi, or rales. GI: Soft, non-distended, and non-tender. MS: No lower extremity edema; No deformity. Skin: Warm and dry. Neuro:  No focal deficits. Psych: Normal affect. Responds appropriately.   Labs    High Sensitivity Troponin:   Recent Labs  Lab 08/14/22 0115 08/14/22 0530 08/14/22 2209  TROPONINIHS 23,487* >24,000* 9,506*     Chemistry Recent Labs  Lab 08/14/22 0115 08/14/22 0530 08/14/22 2209 08/15/22 0536  NA 133* 131* 134* 137  K 4.9 4.1 3.2* 3.4*  CL 97* 93* 94* 97*  CO2 22 24 27 31   GLUCOSE 114* 127* 145* 121*  BUN 26* 31* 29* 23  CREATININE 3.79* 3.87* 2.28* 1.68*  CALCIUM 5.9* 6.0* 6.5* 7.0*  MG 1.7  1.7  --   --  1.7  PROT 4.4* 4.4*  --  4.6*  ALBUMIN 2.5* 2.6*  --  2.4*  AST 4,317* 4,791*  --  1,956*  ALT 1,059* 1,073*  --  832*  ALKPHOS 36* 37*  --  48  BILITOT 0.6 0.9  --  0.6  GFRNONAA 17* 17* 31* 45*  ANIONGAP Lipids No results for input(s): "CHOL", "TRIG", "HDL", "LABVLDL", "LDLCALC", "CHOLHDL" in the last 168 hours.  Hematology Recent Labs  Lab 08/14/22 0115 08/14/22 1217 08/15/22 0536  WBC 13.3*  13.5* 11.1* 12.3*  RBC 1.66*  1.66* 2.14* 2.15*  HGB 5.9*  5.9* 7.2* 7.1*  HCT 17.8*  17.9* 20.8* 21.0*  MCV 107.2*  107.8* 97.2 97.7  MCH 35.5*  35.5* 33.6 33.0  MCHC 33.1  33.0 34.6 33.8  RDW 13.2  13.2 16.2* 17.2*  PLT 205  205 157 163   Thyroid No results for input(s): "TSH", "FREET4" in the last 168 hours.  BNP Recent Labs  Lab 08/14/22 0115  BNP 103.3*    DDimer No results for input(s): "DDIMER" in the last 168 hours.   Radiology    ECHOCARDIOGRAM COMPLETE  Result Date: 08/14/2022    ECHOCARDIOGRAM REPORT   Patient Name:   Dean Wang Date of Exam: 08/14/2022 Medical Rec #:  [id number]       Height:       68.0 in Accession #:     [id number]      Weight:       209.4 lb Date of Birth:  12-Sep-1960       BSA:          2.084 m Patient Age:    61 years        BP:           147/59 mmHg Patient Gender: M               HR:           78 bpm. Exam Location:  Inpatient Procedure: 2D Echo, Cardiac Doppler, Color Doppler and Intracardiac            Opacification Agent Indications:    Acute myocardial infarction, unspecified I21.9  History:        Patient has no prior history of Echocardiogram examinations.                 CHF, CAD; Risk Factors:Hypertension and Dyslipidemia.  Sonographer:    Mike Gip Referring Phys: [id number] JESSICA MARSHALL IMPRESSIONS  1. There is mild hypokinesis in the distribution of the mid-LAD artery. Other segments are hyperdynamic. There is no left ventricular thrombus (Definity contrast was used). Left ventricular ejection fraction, by estimation, is 55 to 60%. The left ventricle has normal function. The left ventricle demonstrates regional wall motion abnormalities (see scoring diagram/findings for description). Left ventricular diastolic parameters are consistent with Grade II diastolic dysfunction (pseudonormalization). Elevated left atrial pressure.  2. Right ventricular systolic function is normal. The right ventricular size is normal.  3. The mitral valve is normal in structure. No evidence of mitral valve regurgitation. No evidence of mitral stenosis.  4. The aortic valve is normal in structure. Aortic valve regurgitation is not visualized. No aortic stenosis is present.  5. The inferior vena cava is dilated in size with >50% respiratory variability, suggesting right atrial pressure of 8 mmHg. FINDINGS  Left Ventricle: There is mild hypokinesis in the distribution of the mid-LAD artery. Other segments are hyperdynamic. There is no left ventricular thrombus (Definity contrast was used). Left ventricular ejection fraction, by estimation, is 55 to 60%. The left ventricle has normal function. The left ventricle  demonstrates regional wall motion abnormalities. Definity contrast agent was given IV to delineate the left ventricular endocardial borders. The left ventricular internal cavity size was normal in size.  There is no left ventricular hypertrophy. Left ventricular diastolic parameters are consistent with Grade II diastolic dysfunction (pseudonormalization). Elevated left atrial pressure.  LV Wall Scoring: The basal anterolateral segment is akinetic. The mid and distal anterior wall, mid anteroseptal segment, apical lateral segment, and apex are hypokinetic. The inferior septum, entire inferior wall, posterior wall, basal anteroseptal segment, mid anterolateral segment, and basal anterior segment are normal. Right Ventricle: The right ventricular size is normal. No increase in right ventricular wall thickness. Right ventricular systolic function is normal. Left Atrium: Left atrial size was normal in size. Right Atrium: Right atrial size was normal in size. Pericardium: There is no evidence of pericardial effusion. Mitral Valve: The mitral valve is normal in structure. No evidence of mitral valve regurgitation. No evidence of mitral valve stenosis. Tricuspid Valve: The tricuspid valve is normal in structure. Tricuspid valve regurgitation is not demonstrated. No evidence of tricuspid stenosis. Aortic Valve: The aortic valve is normal in structure. Aortic valve regurgitation is not visualized. No aortic stenosis is present. Pulmonic Valve: The pulmonic valve was normal in structure. Pulmonic valve regurgitation is not visualized. No evidence of pulmonic stenosis. Aorta: The aortic root is normal in size and structure. Venous: The inferior vena cava is dilated in size with greater than 50% respiratory variability, suggesting right atrial pressure of 8 mmHg. IAS/Shunts: No atrial level shunt detected by color flow Doppler.  LEFT VENTRICLE PLAX 2D LVIDd:         5.40 cm      Diastology LVIDs:         3.70 cm      LV e'  medial:    6.53 cm/s LV PW:         1.00 cm      LV E/e' medial:  16.4 LV IVS:        1.10 cm      LV e' lateral:   10.40 cm/s LVOT diam:     2.20 cm      LV E/e' lateral: 10.3 LV SV:         93 LV SV Index:   45 LVOT Area:     3.80 cm  LV Volumes (MOD) LV vol d, MOD A2C: 182.0 ml LV vol d, MOD A4C: 214.0 ml LV vol s, MOD A2C: 66.6 ml LV vol s, MOD A4C: 87.2 ml LV SV MOD A2C:     115.4 ml LV SV MOD A4C:     214.0 ml LV SV MOD BP:      119.8 ml RIGHT VENTRICLE             IVC RV Basal diam:  4.40 cm     IVC diam: 2.20 cm RV S prime:     21.10 cm/s TAPSE (M-mode): 2.8 cm LEFT ATRIUM             Index        RIGHT ATRIUM           Index LA diam:        3.60 cm 1.73 cm/m   RA Area:     17.50 cm LA Vol (A2C):   81.2 ml 38.98 ml/m  RA Volume:   47.90 ml  22.98 ml/m LA Vol (A4C):   69.2 ml 33.20 ml/m LA Biplane Vol: 68.6 ml 32.91 ml/m  AORTIC VALVE LVOT Vmax:   147.00 cm/s LVOT Vmean:  99.700 cm/s LVOT VTI:    0.245 m  AORTA Ao Root diam: 3.70 cm Ao Asc  diam:  3.40 cm MITRAL VALVE MV Area (PHT): 2.59 cm     SHUNTS MV Decel Time: 293 msec     Systemic VTI:  0.24 m MV E velocity: 107.00 cm/s  Systemic Diam: 2.20 cm MV A velocity: 110.00 cm/s MV E/A ratio:  0.97 Mihai Croitoru MD Electronically signed by Thurmon Fair MD Signature Date/Time: 08/14/2022/3:53:18 PM    Final    US Abdomen Limited RUQ (LIVER/GB)  Result Date: 08/14/2022 CLINICAL DATA:  Transaminitis EXAM: ULTRASOUND ABDOMEN LIMITED RIGHT UPPER QUADRANT COMPARISON:  CT chest abdomen pelvis 08/13/2022 FINDINGS: Gallbladder: Gallstones: None Sludge: None Gallbladder Wall: Within normal limits Pericholecystic fluid: None Sonographic Murphy's Sign: Negative per technologist Common bile duct: Diameter: 3 mm Liver: Parenchymal echogenicity: Diffusely increased Contours: Normal Lesions: None Portal vein: Patent.  Hepatopetal flow Other: None. IMPRESSION: Diffuse increased echogenicity of the hepatic parenchyma is a nonspecific indicator of hepatocellular  dysfunction, most commonly steatosis. Electronically Signed   By: Acquanetta Belling M.D.   On: 08/14/2022 08:11    Cardiac Studies   Echocardiogram 08/14/2022: Impressions: 1. There is mild hypokinesis in the distribution of the mid-LAD artery.  Other segments are hyperdynamic. There is no left ventricular thrombus  (Definity contrast was used). Left ventricular ejection fraction, by  estimation, is 55 to 60%. The left  ventricle has normal function. The left ventricle demonstrates regional  wall motion abnormalities (see scoring diagram/findings for description).  Left ventricular diastolic parameters are consistent with Grade II  diastolic dysfunction  (pseudonormalization). Elevated left atrial pressure.   2. Right ventricular systolic function is normal. The right ventricular  size is normal.   3. The mitral valve is normal in structure. No evidence of mitral valve  regurgitation. No evidence of mitral stenosis.   4. The aortic valve is normal in structure. Aortic valve regurgitation is  not visualized. No aortic stenosis is present.   5. The inferior vena cava is dilated in size with >50% respiratory  variability, suggesting right atrial pressure of 8 mmHg.    Patient Profile     62 y.o. male with a history of CAD with NSTEMI in 05/2020 s/p DES to LAD, HFrEF with EF as low as 45% at time of NSTEMI in 05/2020 but has since normalized to 55-60% on repeat Echo in 6/022, hypertension, hyperlipidemia, and anxiety/ depression who was admitted on 08/13/2022 after a fall where he sustained a head injury. He was noted to be hypotensive and hypoxic/ short of breath on EMS arrival. CT a [facility] showed no acute intracranial abnormality or spinal fractures. Patient was subsequently transferred to [facility] and admitted for rhabdomyolysis, AKI, lactic acidosis, and acute blood loss anemia. He initially required on pressors, IV fluids, and bicarb infusion. Cardiology was consulted for elevated  troponin.  Assessment & Plan    Elevated Troponin Patient was admitted with rhabdomyolysis, AKI, and acute blood loss anemia after sustaining a fall as described above. Work-up showed markedly elevated troponin 23,487;  >24,000; 9,506. EKG showed no acute ischemic changes. Echo showed LVEF of 55-60% with mild hypokinesis in the distrubution of the mid LAD artery and grade 2 diastolic dysfunction. - Patient denies any chest pain prior to fall or since fall. - He was initially started on IV Heparin but this was stopped given significant anemia with hemoglobin of 5.9 and reassuring EKG with no chest pain. - There are no plans for cath at this time given patient is chest pain free, Echo showed normal LV function with no significant  wall motion abnormalities, acute anemia, and AKI (although renal function is improving). Troponin elevation may be all demand ischemia. Could consider outpatient ischemic evaluation once he recovers from acute illness (will defer to primary Cardiologist at [facility]).   CAD History of NSTEMI in 05/2020 s/p DES to LAD. - No chest pain. - Home Aspirin and Effient on hold due to acute anemia. - Statin on hold due to elevated LFTs in setting of rhabdomyolysis.   Chronic HFrEF with Recovered EF LVEF as low as 45% at time of NSTEMI in 2022 but has since normalized. Echo this admission showed LVEF of 55-60% with mild hypokinesis in the distrubution of the mid LAD artery and grade 2 diastolic dysfunction. - Euvolemic on exam.  - Home Losartan, Toprol-XL, and Spironolactone were held on admission due to hypotension requiring pressors. Pressors have now been stopped. Consider restarting home Toprol-XL given possible accelerated idioventricular rhythm. Will continue to hold on Losartan and Spironolactone for now as renal function continues to improve.  Wide Complex QRS Telemetry shows underlying normal sinus rhythm with rates in the 60s to 70s and occasional a wide complex QRS (aberrancy  vs accelerated idioventricular rhythm) with rates in the high 90s. Longest episode was about 6 minutes. - Patient asymptomatic with this. - Potassium slightly low at 3.4 today. Being supplemented by primary team. - Magnesium 1.7 today. Being supplemented by primary team. - Will check TSH. - Consider restarting home Toprol-XL. - Will review with MD.  Hypertension Patient has a history of hypertension but was hypotensive on arrival requiring Levophed. Levophed was stopped on 4/23.   Hyperlipidemia - On Lipitor  daily at home. This is being held due to significant elevated LFTs in setting of rhabdomyolysis. Can resume this once LFTs normalize.  AKI Creatinine peaked at 3.87 this admission. This is in the setting of rhabdomyolysis and hypotension. Improved with IV fluids and pressors.  - Creatinine 1.68 today.  - Continue to monitor closely.   Anemia of Acute Blood Loss Hemoglobin 5.9 on admission. S/p 2 units of PRBCs. Felt to likely be related to fall but possible due to GI loss as well given alcohol intake. - Hemoglobin 7.1 today. - Continue to hold DAPT for now. - Management per PCCM. Consider GI consult.  Mechanical Fall Patient's fall was clearly mechanical. He slipped on the wet floor after getting out of the shower. He denies any prodromal symptoms prior to fall. He did have a syncopal episode after he fell and hit his head where he sustained a head laceration with significant bleeding. Syncope was not the cause of the fall.  Otherwise, per primary team: - Rhabdomyolysis - Lactic acidosis: On sodium bicarbonate infusion - Hypotension - Transaminitis   For questions or updates, please contact [facility] Please consult [email or web address] for contact info under        Signed, Corrin Parker, PA-C  08/15/2022, 7:52 AM

## 2022-08-15 NOTE — Progress Notes (Signed)
Patient inquired about removing foley, according to notews on 08/14/22-318 -presented from outside facility with Foley in place, no Foley ordered in our system.  In the setting of rhabdomyolysis, indication to be strict I's and O's.  Continue Foley order for now, readdress during daily rounds depending on mobility.  Reached out to nurse at phone number [phone] and per algorithm that she pointed me to, patient would be due for a voiding trial on 08/16/22.  Unstable critically ill patients first 24-48 hours   Targeted Temperature Management Titrating vasoactive medications and/or large volume resuscitation (anticipated ongoing volumes >31ml/kg) Acute increase in ICP >29mmHg Decompensation related to movement (Example: Respiratory or hemodynamic instability with turning   Patient is still under 24-48 hour period.

## 2022-08-15 NOTE — Progress Notes (Signed)
Pharmacy: Electrolytes  K 3.4, Mag 1.7, CorCa 8.28  Plan: Kdur 40 po x1, Mag 2 gm IVPB x 1, Cagluc 2 gm x 2 per Elink protocol  Herby Abraham, Pharm.D Use secure chat for questions 08/15/2022 9:02 AM

## 2022-08-16 DIAGNOSIS — K7200 Acute and subacute hepatic failure without coma: Secondary | ICD-10-CM | POA: Diagnosis not present

## 2022-08-16 DIAGNOSIS — R578 Other shock: Secondary | ICD-10-CM

## 2022-08-16 DIAGNOSIS — I2489 Other forms of acute ischemic heart disease: Secondary | ICD-10-CM | POA: Diagnosis not present

## 2022-08-16 DIAGNOSIS — D62 Acute posthemorrhagic anemia: Secondary | ICD-10-CM

## 2022-08-16 DIAGNOSIS — N179 Acute kidney failure, unspecified: Secondary | ICD-10-CM

## 2022-08-16 DIAGNOSIS — W19XXXA Unspecified fall, initial encounter: Secondary | ICD-10-CM

## 2022-08-16 DIAGNOSIS — Y92009 Unspecified place in unspecified non-institutional (private) residence as the place of occurrence of the external cause: Secondary | ICD-10-CM

## 2022-08-16 DIAGNOSIS — M6282 Rhabdomyolysis: Secondary | ICD-10-CM

## 2022-08-16 LAB — BASIC METABOLIC PANEL
Anion gap: 10 (ref 5–15)
BUN: 13 mg/dL (ref 8–23)
CO2: 27 mmol/L (ref 22–32)
Calcium: 7.5 mg/dL — ABNORMAL LOW (ref 8.9–10.3)
Chloride: 98 mmol/L (ref 98–111)
Creatinine, Ser: 1.04 mg/dL (ref 0.61–1.24)
GFR, Estimated: >60 mL/min (ref >60)
Glucose, Bld: 174 mg/dL — ABNORMAL HIGH (ref 70–99)
Potassium: 3.3 mmol/L — ABNORMAL LOW (ref 3.5–5.1)
Sodium: 135 mmol/L (ref 135–145)

## 2022-08-16 LAB — HEPATIC FUNCTION PANEL
ALT: 513 U/L — ABNORMAL HIGH (ref 0–44)
AST: 509 U/L — ABNORMAL HIGH (ref 15–41)
Albumin: 2.8 g/dL — ABNORMAL LOW (ref 3.5–5.0)
Alkaline Phosphatase: 58 U/L (ref 38–126)
Bilirubin, Direct: 0.1 mg/dL (ref 0.0–0.2)
Indirect Bilirubin: 0.3 mg/dL (ref 0.3–0.9)
Total Bilirubin: 0.4 mg/dL (ref 0.3–1.2)
Total Protein: 5.2 g/dL — ABNORMAL LOW (ref 6.5–8.1)

## 2022-08-16 LAB — CBC
HCT: 22.9 % — ABNORMAL LOW (ref 39.0–52.0)
Hemoglobin: 7.4 g/dL — ABNORMAL LOW (ref 13.0–17.0)
MCH: 33.2 pg (ref 26.0–34.0)
MCHC: 32.3 g/dL (ref 30.0–36.0)
MCV: 102.7 fL — ABNORMAL HIGH (ref 80.0–100.0)
Platelets: 219 10*3/uL (ref 150–400)
RBC: 2.23 MIL/uL — ABNORMAL LOW (ref 4.22–5.81)
RDW: 16.7 % — ABNORMAL HIGH (ref 11.5–15.5)
WBC: 8.1 10*3/uL (ref 4.0–10.5)
nRBC: 0.9 % — ABNORMAL HIGH (ref 0.0–0.2)

## 2022-08-16 LAB — ABO/RH: ABO/RH(D): A POS

## 2022-08-16 MED ORDER — POTASSIUM CHLORIDE CRYS ER 20 MEQ PO TBCR
40.0000 meq | EXTENDED_RELEASE_TABLET | Freq: Once | ORAL | Status: AC
  Administered 2022-08-16: 40 meq via ORAL
  Filled 2022-08-16: qty 2

## 2022-08-16 NOTE — Hospital Course (Addendum)
63 year old man presented from outside hospital after a fall.  Was found to be anemic secondary to blood loss from head wound and hypotensive.  Requiring vasopressor support.  Accepted by critical care.  Treated for acute blood loss anemia, AKI, rhabdomyolysis.

## 2022-08-16 NOTE — Discharge Summary (Addendum)
Physician Discharge Summary   Patient: Dean Wang MRN: [id number] DOB: 1961-01-04  Admit date:     08/13/2022  Discharge date: 08/16/22  Discharge Physician: Brendia Sacks   PCP: Kirstie Peri, MD   Recommendations at discharge:   Hemorrhagic shock, acute blood loss anemia secondary to forehead laceration secondary to fall at home.  Associated with lactic acidosis, shock liver, AKI.  No evidence of sepsis. Suggest repeat CBC, CMP as outpatient  Elevated troponin Demand ischemia  Discharge Diagnoses: Principle Hemorrhagic shock  Acute blood loss anemia  Forehead laceration  Shock liver AKI Fall at home Acute rhabdomyolysis Elevated troponin Demand ischemia PMH CAD, NSTEMI February 2022, status post DES to LAD  Hospital Course: 62 year old man presented from outside hospital after a fall.  Was found to be anemic secondary to blood loss from head wound and hypotensive.  Requiring vasopressor support.  Accepted by critical care.  Treated for acute blood loss anemia, AKI, rhabdomyolysis.  Hemorrhagic shock, acute blood loss anemia secondary to forehead laceration secondary to fall at home.Effient contributed to excessive bleeding of laceration from fall.  Associated with lactic acidosis, shock liver, AKI.  No evidence of sepsis. Treated with norepinephrine fluids and blood products. Resolved.  Hemoglobin stable status post PRBCs. LFTs trending down.  Follow-up as an outpatient.  AKI Baseline Cr 0.8, up to 2.7 on admission Essentially resolved on discharge.  Fall at home Acute rhabdomyolysis Mechanical per patient.  He stepped out of the shower and slipped.  Not for the first time either.  Did lose consciousness briefly. Did admit to having had several drinks. CT head negative.  CK trending down. Forehead laceration was not repaired.  Will heal by secondary intention. No further evaluation suggested.   Elevated troponin Demand ischemia PMH CAD, NSTEMI February  2022, status post DES to LAD Seen by cardiology.  Echocardiogram reassuring.  Normal LVEF with no wall motion abnormalities of significance.  No cath or further evaluation per cardiology. Resume Effient on discharge.      Consultants:  Cardiology Admitted by PCCM  Procedures performed:  None   Disposition: Home Diet recommendation:  Discharge Diet Orders (From admission, onward)     Start     Ordered   08/16/22 0000  Diet general        08/16/22 1224           Regular diet DISCHARGE MEDICATION: Allergies as of 08/16/2022       Reactions   Benadryl Allergy [diphenhydramine Hcl] Anaphylaxis, Anxiety   Codeine Nausea And Vomiting   Amiodarone Other (See Comments)   Burning in chest- pt unsure if chest discomfort was from the amiodarone bolus or from the suspected Vtach/chest pain he was already experiencing   Demerol [meperidine Hcl] Nausea And Vomiting        Medication List     TAKE these medications    acetaminophen 500 MG tablet Commonly known as: TYLENOL Take 1,000 mg by mouth every 6 (six) hours as needed for moderate pain.   Advair HFA 115-21 MCG/ACT inhaler Generic drug: fluticasone-salmeterol Inhale 3 puffs into the lungs daily.   albuterol 108 (90 Base) MCG/ACT inhaler Commonly known as: VENTOLIN HFA Inhale 2-3 puffs into the lungs daily.   aspirin EC 81 MG tablet Take 81 mg by mouth daily.   atorvastatin 80 MG tablet Commonly known as: LIPITOR Take 80 mg by mouth daily.   EYE DROPS OP Place 1-2 drops into both eyes as needed (dry eye).   ezetimibe 10  MG tablet Commonly known as: ZETIA Take 10 mg by mouth daily.   furosemide 20 MG tablet Commonly known as: LASIX Take 20 mg by mouth daily.   losartan 100 MG tablet Commonly known as: COZAAR Take 100 mg by mouth daily.   metoprolol succinate 25 MG 24 hr tablet Commonly known as: TOPROL-XL Take 25 mg by mouth daily.   prasugrel 10 MG Tabs tablet Commonly known as: EFFIENT Take 10  mg by mouth daily.   spironolactone 25 MG tablet Commonly known as: ALDACTONE Take 25 mg by mouth daily.               Discharge Care Instructions  (From admission, onward)           Start     Ordered   08/16/22 0000  Discharge wound care:       Comments: Keep forehead wound clean   08/16/22 1224            Follow-up Information     Kirstie Peri, MD Follow up in 2 week(s).   Specialty: Internal Medicine Contact information: [address] [place] [address] [phone]                Feels good Wants to go home  Discharge Exam: Filed Weights   08/13/22 2224 08/14/22 0703 08/15/22 0500  Weight: 95 kg 95 kg 99.5 kg   Physical Exam Vitals reviewed.  Constitutional:      General: He is not in acute distress.    Appearance: He is not ill-appearing or toxic-appearing.  Cardiovascular:     Rate and Rhythm: Normal rate and regular rhythm.     Heart sounds: No murmur heard. Pulmonary:     Effort: Pulmonary effort is normal. No respiratory distress.     Breath sounds: No wheezing, rhonchi or rales.  Neurological:     Mental Status: He is alert.  Psychiatric:        Mood and Affect: Mood normal.        Behavior: Behavior normal.      Condition at discharge: good  The results of significant diagnostics from this hospitalization (including imaging, microbiology, ancillary and laboratory) are listed below for reference.   Imaging Studies: ECHOCARDIOGRAM COMPLETE  Result Date: 08/14/2022    ECHOCARDIOGRAM REPORT   Patient Name:   Dean Wang Date of Exam: 08/14/2022 Medical Rec #:  [id number]       Height:       68.0 in Accession #:    [id number]      Weight:       209.4 lb Date of Birth:  February 01, 1961       BSA:          2.084 m Patient Age:    62 years        BP:           147/59 mmHg Patient Gender: M               HR:           78 bpm. Exam Location:  Inpatient Procedure: 2D Echo, Cardiac Doppler, Color Doppler and Intracardiac             Opacification Agent Indications:    Acute myocardial infarction, unspecified I21.9  History:        Patient has no prior history of Echocardiogram examinations.                 CHF, CAD; Risk Factors:Hypertension  and Dyslipidemia.  Sonographer:    Mike Gip Referring Phys: [id number] JESSICA MARSHALL IMPRESSIONS  1. There is mild hypokinesis in the distribution of the mid-LAD artery. Other segments are hyperdynamic. There is no left ventricular thrombus (Definity contrast was used). Left ventricular ejection fraction, by estimation, is 55 to 60%. The left ventricle has normal function. The left ventricle demonstrates regional wall motion abnormalities (see scoring diagram/findings for description). Left ventricular diastolic parameters are consistent with Grade II diastolic dysfunction (pseudonormalization). Elevated left atrial pressure.  2. Right ventricular systolic function is normal. The right ventricular size is normal.  3. The mitral valve is normal in structure. No evidence of mitral valve regurgitation. No evidence of mitral stenosis.  4. The aortic valve is normal in structure. Aortic valve regurgitation is not visualized. No aortic stenosis is present.  5. The inferior vena cava is dilated in size with >50% respiratory variability, suggesting right atrial pressure of 8 mmHg. FINDINGS  Left Ventricle: There is mild hypokinesis in the distribution of the mid-LAD artery. Other segments are hyperdynamic. There is no left ventricular thrombus (Definity contrast was used). Left ventricular ejection fraction, by estimation, is 55 to 60%. The left ventricle has normal function. The left ventricle demonstrates regional wall motion abnormalities. Definity contrast agent was given IV to delineate the left ventricular endocardial borders. The left ventricular internal cavity size was normal in size. There is no left ventricular hypertrophy. Left ventricular diastolic parameters are consistent with Grade II diastolic  dysfunction (pseudonormalization). Elevated left atrial pressure.  LV Wall Scoring: The basal anterolateral segment is akinetic. The mid and distal anterior wall, mid anteroseptal segment, apical lateral segment, and apex are hypokinetic. The inferior septum, entire inferior wall, posterior wall, basal anteroseptal segment, mid anterolateral segment, and basal anterior segment are normal. Right Ventricle: The right ventricular size is normal. No increase in right ventricular wall thickness. Right ventricular systolic function is normal. Left Atrium: Left atrial size was normal in size. Right Atrium: Right atrial size was normal in size. Pericardium: There is no evidence of pericardial effusion. Mitral Valve: The mitral valve is normal in structure. No evidence of mitral valve regurgitation. No evidence of mitral valve stenosis. Tricuspid Valve: The tricuspid valve is normal in structure. Tricuspid valve regurgitation is not demonstrated. No evidence of tricuspid stenosis. Aortic Valve: The aortic valve is normal in structure. Aortic valve regurgitation is not visualized. No aortic stenosis is present. Pulmonic Valve: The pulmonic valve was normal in structure. Pulmonic valve regurgitation is not visualized. No evidence of pulmonic stenosis. Aorta: The aortic root is normal in size and structure. Venous: The inferior vena cava is dilated in size with greater than 50% respiratory variability, suggesting right atrial pressure of 8 mmHg. IAS/Shunts: No atrial level shunt detected by color flow Doppler.  LEFT VENTRICLE PLAX 2D LVIDd:         5.40 cm      Diastology LVIDs:         3.70 cm      LV e' medial:    6.53 cm/s LV PW:         1.00 cm      LV E/e' medial:  16.4 LV IVS:        1.10 cm      LV e' lateral:   10.40 cm/s LVOT diam:     2.20 cm      LV E/e' lateral: 10.3 LV SV:         93 LV SV Index:  45 LVOT Area:     3.80 cm  LV Volumes (MOD) LV vol d, MOD A2C: 182.0 ml LV vol d, MOD A4C: 214.0 ml LV vol s, MOD  A2C: 66.6 ml LV vol s, MOD A4C: 87.2 ml LV SV MOD A2C:     115.4 ml LV SV MOD A4C:     214.0 ml LV SV MOD BP:      119.8 ml RIGHT VENTRICLE             IVC RV Basal diam:  4.40 cm     IVC diam: 2.20 cm RV S prime:     21.10 cm/s TAPSE (M-mode): 2.8 cm LEFT ATRIUM             Index        RIGHT ATRIUM           Index LA diam:        3.60 cm 1.73 cm/m   RA Area:     17.50 cm LA Vol (A2C):   81.2 ml 38.98 ml/m  RA Volume:   47.90 ml  22.98 ml/m LA Vol (A4C):   69.2 ml 33.20 ml/m LA Biplane Vol: 68.6 ml 32.91 ml/m  AORTIC VALVE LVOT Vmax:   147.00 cm/s LVOT Vmean:  99.700 cm/s LVOT VTI:    0.245 m  AORTA Ao Root diam: 3.70 cm Ao Asc diam:  3.40 cm MITRAL VALVE MV Area (PHT): 2.59 cm     SHUNTS MV Decel Time: 293 msec     Systemic VTI:  0.24 m MV E velocity: 107.00 cm/s  Systemic Diam: 2.20 cm MV A velocity: 110.00 cm/s MV E/A ratio:  0.97 Mihai Croitoru MD Electronically signed by Thurmon Fair MD Signature Date/Time: 08/14/2022/3:53:18 PM    Final    US Abdomen Limited RUQ (LIVER/GB)  Result Date: 08/14/2022 CLINICAL DATA:  Transaminitis EXAM: ULTRASOUND ABDOMEN LIMITED RIGHT UPPER QUADRANT COMPARISON:  CT chest abdomen pelvis 08/13/2022 FINDINGS: Gallbladder: Gallstones: None Sludge: None Gallbladder Wall: Within normal limits Pericholecystic fluid: None Sonographic Murphy's Sign: Negative per technologist Common bile duct: Diameter: 3 mm Liver: Parenchymal echogenicity: Diffusely increased Contours: Normal Lesions: None Portal vein: Patent.  Hepatopetal flow Other: None. IMPRESSION: Diffuse increased echogenicity of the hepatic parenchyma is a nonspecific indicator of hepatocellular dysfunction, most commonly steatosis. Electronically Signed   By: Acquanetta Belling M.D.   On: 08/14/2022 08:11    Microbiology: Results for orders placed or performed during the hospital encounter of 08/13/22  MRSA Next Gen by PCR, Nasal     Status: None   Collection Time: 08/14/22  4:25 AM   Specimen: Nasal Mucosa; Nasal  Swab  Result Value Ref Range Status   MRSA by PCR Next Gen NOT DETECTED NOT DETECTED Final    Comment: (NOTE) The GeneXpert MRSA Assay (FDA approved for NASAL specimens only), is one component of a comprehensive MRSA colonization surveillance program. It is not intended to diagnose MRSA infection nor to guide or monitor treatment for MRSA infections. Test performance is not FDA approved in patients less than 43 years old. Performed at [facility], [address], [place], [place] [address]     Labs: CBC: Recent Labs  Lab 08/14/22 0115 08/14/22 1217 08/15/22 0536 08/16/22 0835  WBC 13.3*  13.5* 11.1* 12.3* 8.1  NEUTROABS 11.0*  --   --   --   HGB 5.9*  5.9* 7.2* 7.1* 7.4*  HCT 17.8*  17.9* 20.8* 21.0* 22.9*  MCV 107.2*  107.8*  97.2 97.7 102.7*  PLT 205  205 157 163 219   Basic Metabolic Panel: Recent Labs  Lab 08/14/22 0115 08/14/22 0530 08/14/22 2209 08/15/22 0536 08/16/22 0835  NA 133* 131* 134* 137 135  K 4.9 4.1 3.2* 3.4* 3.3*  CL 97* 93* 94* 97* 98  CO2 22 24 27 31 27   GLUCOSE 114* 127* 145* 121* 174*  BUN 26* 31* 29* 23 13  CREATININE 3.79* 3.87* 2.28* 1.68* 1.04  CALCIUM 5.9* 6.0* 6.5* 7.0* 7.5*  MG 1.7  1.7  --   --  1.7  --   PHOS 7.3*  7.5*  --   --  2.7  --    Liver Function Tests: Recent Labs  Lab 08/14/22 0115 08/14/22 0530 08/15/22 0536 08/16/22 0835  AST 4,317* 4,791* 1,956* 509*  ALT 1,059* 1,073* 832* 513*  ALKPHOS 36* 37* 48 58  BILITOT 0.6 0.9 0.6 0.4  PROT 4.4* 4.4* 4.6* 5.2*  ALBUMIN 2.5* 2.6* 2.4* 2.8*   CBG: No results for input(s): "GLUCAP" in the last 168 hours.  Discharge time spent: greater than 30 minutes.  Signed: Brendia Sacks, MD [facility] 08/16/2022

## 2022-09-05 ENCOUNTER — Encounter: Payer: Self-pay | Admitting: "Endocrinology

## 2022-09-05 ENCOUNTER — Telehealth: Payer: Self-pay | Admitting: "Endocrinology

## 2022-09-05 NOTE — Telephone Encounter (Signed)
Called pt bc his PCP wants him to get back in to see Dr Fransico Him. He needs to go to [facility] and do his urine that DR Fransico Him has ordered. No answer or VM set up

## 2023-07-29 ENCOUNTER — Emergency Department (HOSPITAL_COMMUNITY)

## 2023-07-29 ENCOUNTER — Encounter (HOSPITAL_COMMUNITY): Payer: Self-pay

## 2023-07-29 ENCOUNTER — Other Ambulatory Visit: Payer: Self-pay

## 2023-07-29 ENCOUNTER — Emergency Department (HOSPITAL_COMMUNITY)
Admission: EM | Admit: 2023-07-29 | Discharge: 2023-07-30 | Disposition: A | Discharge status: Home / Self Care | Attending: Emergency Medicine | Admitting: Emergency Medicine

## 2023-07-29 DIAGNOSIS — M79671 Pain in right foot: Secondary | ICD-10-CM

## 2023-07-29 DIAGNOSIS — R790 Abnormal level of blood mineral: Secondary | ICD-10-CM | POA: Insufficient documentation

## 2023-07-29 DIAGNOSIS — L03115 Cellulitis of right lower limb: Secondary | ICD-10-CM | POA: Insufficient documentation

## 2023-07-29 DIAGNOSIS — M79672 Pain in left foot: Secondary | ICD-10-CM | POA: Insufficient documentation

## 2023-07-29 DIAGNOSIS — I10 Essential (primary) hypertension: Secondary | ICD-10-CM | POA: Diagnosis not present

## 2023-07-29 DIAGNOSIS — Z7982 Long term (current) use of aspirin: Secondary | ICD-10-CM | POA: Diagnosis not present

## 2023-07-29 DIAGNOSIS — Z79899 Other long term (current) drug therapy: Secondary | ICD-10-CM | POA: Diagnosis not present

## 2023-07-29 DIAGNOSIS — I2510 Atherosclerotic heart disease of native coronary artery without angina pectoris: Secondary | ICD-10-CM | POA: Insufficient documentation

## 2023-07-29 HISTORY — DX: Gout, unspecified: M10.9

## 2023-07-29 LAB — CBC WITH DIFFERENTIAL/PLATELET
Abs Immature Granulocytes: 0.04 10*3/uL (ref 0.00–0.07)
Basophils Absolute: 0 10*3/uL (ref 0.0–0.1)
Basophils Relative: 0 %
Eosinophils Absolute: 0 10*3/uL (ref 0.0–0.5)
Eosinophils Relative: 0 %
HCT: 38.2 % — ABNORMAL LOW (ref 39.0–52.0)
Hemoglobin: 12.6 g/dL — ABNORMAL LOW (ref 13.0–17.0)
Immature Granulocytes: 0 %
Lymphocytes Relative: 11 %
Lymphs Abs: 1.4 10*3/uL (ref 0.7–4.0)
MCH: 34.1 pg — ABNORMAL HIGH (ref 26.0–34.0)
MCHC: 33 g/dL (ref 30.0–36.0)
MCV: 103.2 fL — ABNORMAL HIGH (ref 80.0–100.0)
Monocytes Absolute: 1.2 10*3/uL — ABNORMAL HIGH (ref 0.1–1.0)
Monocytes Relative: 10 %
Neutro Abs: 9.5 10*3/uL — ABNORMAL HIGH (ref 1.7–7.7)
Neutrophils Relative %: 79 %
Platelets: 262 10*3/uL (ref 150–400)
RBC: 3.7 MIL/uL — ABNORMAL LOW (ref 4.22–5.81)
RDW: 12.8 % (ref 11.5–15.5)
WBC: 12.2 10*3/uL — ABNORMAL HIGH (ref 4.0–10.5)
nRBC: 0 % (ref 0.0–0.2)

## 2023-07-29 LAB — COMPREHENSIVE METABOLIC PANEL WITH GFR
ALT: 15 U/L (ref 0–44)
AST: 14 U/L — ABNORMAL LOW (ref 15–41)
Albumin: 3.5 g/dL (ref 3.5–5.0)
Alkaline Phosphatase: 55 U/L (ref 38–126)
Anion gap: 15 (ref 5–15)
BUN: 12 mg/dL (ref 8–23)
CO2: 23 mmol/L (ref 22–32)
Calcium: 9.1 mg/dL (ref 8.9–10.3)
Chloride: 99 mmol/L (ref 98–111)
Creatinine, Ser: 0.84 mg/dL (ref 0.61–1.24)
GFR, Estimated: >60 mL/min (ref >60)
Glucose, Bld: 102 mg/dL — ABNORMAL HIGH (ref 70–99)
Potassium: 3.6 mmol/L (ref 3.5–5.1)
Sodium: 137 mmol/L (ref 135–145)
Total Bilirubin: 2 mg/dL — ABNORMAL HIGH (ref 0.0–1.2)
Total Protein: 7.5 g/dL (ref 6.5–8.1)

## 2023-07-29 LAB — TROPONIN I (HIGH SENSITIVITY)
Troponin I (High Sensitivity): 20 ng/L — ABNORMAL HIGH (ref <18)
Troponin I (High Sensitivity): 23 ng/L — ABNORMAL HIGH (ref <18)

## 2023-07-29 LAB — BRAIN NATRIURETIC PEPTIDE: B Natriuretic Peptide: 238 pg/mL — ABNORMAL HIGH (ref 0.0–100.0)

## 2023-07-29 MED ORDER — PREDNISONE 20 MG PO TABS: 40.0000 mg | ORAL_TABLET | Freq: Every day | ORAL | 0 refills | Status: AC

## 2023-07-29 MED ORDER — COLCHICINE 0.6 MG PO TABS: 0.6000 mg | ORAL_TABLET | Freq: Every day | ORAL | 0 refills | Status: DC

## 2023-07-29 MED ORDER — OXYCODONE HCL 5 MG PO TABS
5.0000 mg | ORAL_TABLET | ORAL | Status: DC
  Filled 2023-07-29: qty 1

## 2023-07-29 MED ORDER — ACETAMINOPHEN 325 MG PO TABS
650.0000 mg | ORAL_TABLET | Freq: Once | ORAL | Status: AC
  Administered 2023-07-29: 650 mg via ORAL
  Filled 2023-07-29: qty 2

## 2023-07-29 MED ORDER — CEPHALEXIN 500 MG PO CAPS: 500.0000 mg | ORAL_CAPSULE | Freq: Four times a day (QID) | ORAL | 0 refills | Status: AC

## 2023-07-29 MED ORDER — METHYLPREDNISOLONE SODIUM SUCC 125 MG IJ SOLR
125.0000 mg | Freq: Once | INTRAMUSCULAR | Status: AC
  Administered 2023-07-29: 125 mg via INTRAVENOUS
  Filled 2023-07-29: qty 2

## 2023-07-29 MED ORDER — HYDROCODONE-ACETAMINOPHEN 5-325 MG PO TABS: 1.0000 | ORAL_TABLET | Freq: Once | ORAL | Status: DC

## 2023-07-29 NOTE — ED Triage Notes (Signed)
 Pt arrived via [facility] from home c/o bilateral lower extremity swelling and pain. Pt reports he has difficulty walking. Pt reports having compression socks on recently causing an injury to his right foot. Pt reports recently being prescribed colchicine for possible gout flare up.

## 2023-07-29 NOTE — ED Provider Triage Note (Signed)
 Emergency Medicine Provider Triage Evaluation Note  KENYAN KARNES , a 64 y.o. male  was evaluated in triage.  Pt complains of lower extremity edema.  Review of Systems  Positive: Lower extremity edema and dyspnea Negative:        Chest pain, abdominal pain Physical Exam  BP (!) 156/90   Pulse 73   Temp 98.9 F (37.2 C)   Resp 16   Ht 5\' 8"  (1.727 m)   Wt 96.2 kg   SpO2 100%   BMI 32.23 kg/m  Gen:   Awake, no distress   Resp:  Normal effort  MSK:   Moves extremities without difficulty  Other:  And edema to bilateral lower extremities  Medical Decision Making  Medically screening exam initiated at 3:51 PM.  Appropriate orders placed.  HARALD QUEVEDO was informed that the remainder of the evaluation will be completed by another provider, this initial triage assessment does not replace that evaluation, and the importance of remaining in the ED until their evaluation is complete.  Labs and imaging have been ordered.  Awaiting bed in the back at this time.  Patient is otherwise stable at this point.   Lelon Perla, PA-C 07/29/23 1551

## 2023-07-29 NOTE — ED Provider Notes (Signed)
 [place] EMERGENCY DEPARTMENT AT [facility] Provider Note   CSN: [id number] Arrival date & time: 07/29/23  1417     History  Chief Complaint  Patient presents with   Foot Pain    Dean Wang is a 63 y.o. male.  He has history of CAD, had stents in 2023.,  Hypertension, high cholesterol.  Presents to the ER today for evaluation of bilateral foot swelling and pain.  He reports he had a similar episode about 5 weeks ago and was told it was possibly gout was prescribed colchicine, he states it eventually resolved.  This episode started about a week ago.  He saw his PCP who just told him to do compression stockings and elevate his feet. He states the pain was getting better until 2 days ago and became more severe, today he was not able to ambulate due to the pain.  Denies fevers or chills.    Foot Pain       Home Medications Prior to Admission medications   Medication Sig Start Date End Date Taking? Authorizing Provider  cephALEXin (KEFLEX) 500 MG capsule Take 1 capsule (500 mg total) by mouth 4 (four) times daily for 7 days. 07/29/23 08/05/23 Yes Nastassia Bazaldua A, PA-C  colchicine 0.6 MG tablet Take 1 tablet (0.6 mg total) by mouth daily for 5 days. 07/29/23 08/03/23 Yes Harless Molinari A, PA-C  predniSONE (DELTASONE) 20 MG tablet Take 2 tablets (40 mg total) by mouth daily for 5 days. 07/29/23 08/03/23 Yes Carmin Alvidrez A, PA-C  acetaminophen (TYLENOL) 500 MG tablet Take 1,000 mg by mouth every 6 (six) hours as needed for moderate pain.    [provider]  ADVAIR HFA 409-81 MCG/ACT inhaler Inhale 3 puffs into the lungs daily.    [provider]  albuterol (VENTOLIN HFA) 108 (90 Base) MCG/ACT inhaler Inhale 2-3 puffs into the lungs daily. 10/08/21 10/08/22  [provider]  aspirin EC 81 MG tablet Take 81 mg by mouth daily.    [provider]  atorvastatin (LIPITOR) 80 MG tablet Take 80 mg by mouth daily.    [provider]   Carboxymethylcellulose Sodium (EYE DROPS OP) Place 1-2 drops into both eyes as needed (dry eye).    [provider]  ezetimibe (ZETIA) 10 MG tablet Take 10 mg by mouth daily.    [provider]  furosemide (LASIX) 20 MG tablet Take 20 mg by mouth daily.    [provider]  losartan (COZAAR) 100 MG tablet Take 100 mg by mouth daily.    [provider]  metoprolol succinate (TOPROL-XL) 25 MG 24 hr tablet Take 25 mg by mouth daily.    [provider]  prasugrel (EFFIENT) 10 MG TABS tablet Take 10 mg by mouth daily.    [provider]  spironolactone (ALDACTONE) 25 MG tablet Take 25 mg by mouth daily.    [provider]      Allergies    Benadryl allergy [diphenhydramine hcl], Codeine, Amiodarone, and Demerol [meperidine hcl]    Review of Systems   Review of Systems  Physical Exam Updated Vital Signs BP 131/68   Pulse 63   Temp 98.8 F (37.1 C) (Oral)   Resp 16   Ht 5\' 8"  (1.727 m)   Wt 96.2 kg   SpO2 95%   BMI 32.23 kg/m  Physical Exam Vitals and nursing note reviewed.  Constitutional:      General: He is not in acute distress.  Appearance: He is well-developed.  HENT:     Head: Normocephalic and atraumatic.  Eyes:     Conjunctiva/sclera: Conjunctivae normal.  Cardiovascular:     Rate and Rhythm: Normal rate and regular rhythm.     Heart sounds: No murmur heard. Pulmonary:     Effort: Pulmonary effort is normal. No respiratory distress.     Breath sounds: Normal breath sounds.  Abdominal:     Palpations: Abdomen is soft.     Tenderness: There is no abdominal tenderness.  Musculoskeletal:        General: No swelling.     Cervical back: Neck supple.     Comments: Swelling of bilateral ankles primarily laterally, right greater than left.  There is overlying erythema bilaterally but worse on the right as well.  DP and PT pulses intact bilaterally.  Sensation intact bilaterally.  Patient has normal range of  motion of ankles bilaterally  Skin:    General: Skin is warm and dry.     Capillary Refill: Capillary refill takes less than 2 seconds.  Neurological:     General: No focal deficit present.     Mental Status: He is alert and oriented to person, place, and time.  Psychiatric:        Mood and Affect: Mood normal.     ED Results / Procedures / Treatments   Labs (all labs ordered are listed, but only abnormal results are displayed) Labs Reviewed  CBC WITH DIFFERENTIAL/PLATELET - Abnormal; Notable for the following components:      Result Value   WBC 12.2 (*)    RBC 3.70 (*)    Hemoglobin 12.6 (*)    HCT 38.2 (*)    MCV 103.2 (*)    MCH 34.1 (*)    Neutro Abs 9.5 (*)    Monocytes Absolute 1.2 (*)    All other components within normal limits  COMPREHENSIVE METABOLIC PANEL WITH GFR - Abnormal; Notable for the following components:   Glucose, Bld 102 (*)    AST 14 (*)    Total Bilirubin 2.0 (*)    All other components within normal limits  BRAIN NATRIURETIC PEPTIDE - Abnormal; Notable for the following components:   B Natriuretic Peptide 238.0 (*)    All other components within normal limits  TROPONIN I (HIGH SENSITIVITY) - Abnormal; Notable for the following components:   Troponin I (High Sensitivity) 20 (*)    All other components within normal limits  TROPONIN I (HIGH SENSITIVITY) - Abnormal; Notable for the following components:   Troponin I (High Sensitivity) 23 (*)    All other components within normal limits    EKG None  Radiology DG Chest Port 1 View Result Date: 07/29/2023 CLINICAL DATA:  Dyspnea EXAM: PORTABLE CHEST 1 VIEW COMPARISON:  10/08/2021, report 06/23/2022 FINDINGS: Bronchitic changes. No consolidation, pleural effusion or pneumothorax. Stable cardiomediastinal silhouette IMPRESSION: Bronchitic changes. Electronically Signed   By: Jasmine Pang M.D.   On: 07/29/2023 18:40   US Venous Img Lower Bilateral (DVT) Result Date: 07/29/2023 CLINICAL DATA:   Bilateral pedal edema for 1 month.  History of gout. EXAM: Bilateral lower Extremity Venous Doppler Ultrasound TECHNIQUE: Gray-scale sonography with compression, as well as color and duplex ultrasound, were performed to evaluate the deep venous system(s) from the level of the common femoral vein through the popliteal and proximal calf veins. COMPARISON:  None available FINDINGS: VENOUS Normal compressibility of the common femoral, superficial femoral, and popliteal veins, as well as the visualized calf veins.  Visualized portions of profunda femoral vein and great saphenous vein unremarkable. No filling defects to suggest DVT on grayscale or color Doppler imaging. Doppler waveforms show normal direction of venous flow, normal respiratory plasticity and response to augmentation. OTHER None. Limitations: none IMPRESSION: No  lower extremity DVT. Electronically Signed   By: Acquanetta Belling M.D.   On: 07/29/2023 16:44    Procedures Procedures    Medications Ordered in ED Medications  oxyCODONE (Oxy IR/ROXICODONE) immediate release tablet 5 mg (5 mg Oral Patient Refused/Not Given 07/29/23 2106)  HYDROcodone-acetaminophen (NORCO/VICODIN) 5-325 MG per tablet 1 tablet (has no administration in time range)  acetaminophen (TYLENOL) tablet 650 mg (650 mg Oral Given 07/29/23 1919)  methylPREDNISolone sodium succinate (SOLU-MEDROL) 125 mg/2 mL injection 125 mg (125 mg Intravenous Given 07/29/23 2101)    ED Course/ Medical Decision Making/ A&P                                 Medical Decision Making Differential diagnose includes but not limited to CHF, DVT, cellulitis, gout, other  ED course: Patient here for bilateral foot and ankle pain.  Patient treated with gout and had improvement of symptoms.  PCP felt it was likely due to gout which is consistent with patient's presentation but today his right ankle is more erythematous and very warm to touch suspect cellulitis so we will treat this with Keflex.  DVT studies  negative, labs show slightly elevated BNP, troponin have been ordered at triage as well and delta is only 3 he is not having chest pain, do not feel he needs any further cardiac workup.  Will obtain x-rays and plan to discharge with colchicine, steroid burst, and Keflex for right foot cellulitis.  Will have him follow-up with his PCP.  Will also give Ortho follow-up.  Do not suspect septic arthritis he has good range of motion of his ankle joints.  Signed out pending plain films to Arthor Captain, PA-C  Amount and/or Complexity of Data Reviewed Labs: ordered. Radiology: ordered.  Risk OTC drugs. Prescription drug management.           Final Clinical Impression(s) / ED Diagnoses Final diagnoses:  Bilateral foot pain  Cellulitis of right foot    Rx / DC Orders ED Discharge Orders          Ordered    cephALEXin (KEFLEX) 500 MG capsule  4 times daily        07/29/23 2042    colchicine 0.6 MG tablet  Daily        07/29/23 2050    predniSONE (DELTASONE) 20 MG tablet  Daily        07/29/23 2051              Josem Kaufmann 07/29/23 2211    Rondel Baton, MD 08/01/23 [phone]

## 2023-07-29 NOTE — Discharge Instructions (Addendum)
 You are seen in the ER today for bilateral foot pain, we do feel this is likely gout and are treating with colchicine and steroids.  Your x-ray showed some arthritis.  There are no other acute findings.  We are also prescribing antibiotics as it looks like you have a skin infection to your right foot.  Follow closely with your PCP and/orthopedics, come back for new or worsening symptoms.

## 2023-07-29 NOTE — ED Provider Notes (Signed)
 Gout/ chf/ foot pain- dc . Check xray. Physical Exam  BP 113/66 (BP Location: Right Arm)   Pulse 73   Temp 98.8 F (37.1 C) (Oral)   Resp 14   Ht 5\' 8"  (1.727 m)   Wt 96.2 kg   SpO2 95%   BMI 32.23 kg/m   Physical Exam  Procedures  Procedures  ED Course / MDM    Medical Decision Making Amount and/or Complexity of Data Reviewed Labs: ordered. Radiology: ordered.  Risk OTC drugs. Prescription drug management.   Reviewed the patient's x-rays, no acute findings.  Appropriate for discharge at this time       Dean Wang 07/29/23 2340    Rondel Baton, MD 08/01/23 [phone]

## 2023-10-01 ENCOUNTER — Ambulatory Visit: Attending: Internal Medicine | Admitting: Internal Medicine

## 2023-10-01 ENCOUNTER — Encounter: Payer: Self-pay | Admitting: Internal Medicine

## 2023-10-01 VITALS — BP 152/86 | HR 85 | Ht 68.0 in | Wt 210.4 lb

## 2023-10-01 DIAGNOSIS — I10 Essential (primary) hypertension: Secondary | ICD-10-CM

## 2023-10-01 DIAGNOSIS — I447 Left bundle-branch block, unspecified: Secondary | ICD-10-CM | POA: Insufficient documentation

## 2023-10-01 DIAGNOSIS — Z7902 Long term (current) use of antithrombotics/antiplatelets: Secondary | ICD-10-CM | POA: Insufficient documentation

## 2023-10-01 DIAGNOSIS — I5032 Chronic diastolic (congestive) heart failure: Secondary | ICD-10-CM | POA: Insufficient documentation

## 2023-10-01 DIAGNOSIS — I2510 Atherosclerotic heart disease of native coronary artery without angina pectoris: Secondary | ICD-10-CM | POA: Diagnosis not present

## 2023-10-01 NOTE — Patient Instructions (Signed)
 Medication Instructions:  Your physician has recommended you make the following change in your medication:  Stop taking taking Prasugrel  Continue taking all other medications as prescribed   Labwork: None  Testing/Procedures: None  Follow-Up: Your physician recommends that you schedule a follow-up appointment in: 1 year. You will receive a reminder call in about 8 months reminding you to schedule your appointment. If you don't receive this call, please contact our office.   Any Other Special Instructions Will Be Listed Below (If Applicable). Thank you for choosing [facility]!     If you need a refill on your cardiac medications before your next appointment, please call your pharmacy.
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
## 2024-01-29 DIAGNOSIS — I10 Essential (primary) hypertension: Secondary | ICD-10-CM | POA: Diagnosis not present

## 2024-01-29 DIAGNOSIS — M109 Gout, unspecified: Secondary | ICD-10-CM | POA: Diagnosis not present

## 2024-01-29 DIAGNOSIS — I509 Heart failure, unspecified: Secondary | ICD-10-CM | POA: Diagnosis not present

## 2024-01-29 DIAGNOSIS — Z299 Encounter for prophylactic measures, unspecified: Secondary | ICD-10-CM | POA: Diagnosis not present
